# Patient Record
Sex: Female | Born: 1985
Health system: Southern US, Community
[De-identification: ages and names within clinical notes are randomized; demographics above are authoritative.]

## PROBLEM LIST (undated history)

## (undated) DIAGNOSIS — D573 Sickle-cell trait: Secondary | ICD-10-CM

## (undated) DIAGNOSIS — D759 Disease of blood and blood-forming organs, unspecified: Secondary | ICD-10-CM

## (undated) DIAGNOSIS — K760 Fatty (change of) liver, not elsewhere classified: Secondary | ICD-10-CM

## (undated) DIAGNOSIS — Z789 Other specified health status: Secondary | ICD-10-CM

## (undated) DIAGNOSIS — R7989 Other specified abnormal findings of blood chemistry: Secondary | ICD-10-CM

## (undated) DIAGNOSIS — D649 Anemia, unspecified: Secondary | ICD-10-CM

## (undated) DIAGNOSIS — T8859XA Other complications of anesthesia, initial encounter: Secondary | ICD-10-CM

## (undated) DIAGNOSIS — F1910 Other psychoactive substance abuse, uncomplicated: Secondary | ICD-10-CM

## (undated) HISTORY — PX: NO PAST SURGERIES: SHX2092

## (undated) HISTORY — DX: Anemia, unspecified: D64.9

---

## 2010-02-07 ENCOUNTER — Ambulatory Visit (HOSPITAL_COMMUNITY): Admission: RE | Admit: 2010-02-07 | Discharge: 2010-02-07 | Payer: Self-pay | Admitting: Family Medicine

## 2010-05-04 ENCOUNTER — Encounter: Payer: Self-pay | Admitting: Obstetrics and Gynecology

## 2010-05-04 ENCOUNTER — Inpatient Hospital Stay (HOSPITAL_COMMUNITY): Admission: AD | Admit: 2010-05-04 | Discharge: 2010-05-06 | Payer: Self-pay | Admitting: Obstetrics and Gynecology

## 2010-05-04 ENCOUNTER — Ambulatory Visit: Payer: Self-pay | Admitting: Obstetrics & Gynecology

## 2010-12-21 LAB — CBC
HCT: 31.5 % — ABNORMAL LOW (ref 36.0–46.0)
HCT: 40.6 % (ref 36.0–46.0)
Hemoglobin: 10.9 g/dL — ABNORMAL LOW (ref 12.0–15.0)
MCH: 34.4 pg — ABNORMAL HIGH (ref 26.0–34.0)
MCHC: 34.2 g/dL (ref 30.0–36.0)
MCV: 98.5 fL (ref 78.0–100.0)
MCV: 99.1 fL (ref 78.0–100.0)
RBC: 3.18 MIL/uL — ABNORMAL LOW (ref 3.87–5.11)
RBC: 4.12 MIL/uL (ref 3.87–5.11)
WBC: 7.4 10*3/uL (ref 4.0–10.5)

## 2010-12-21 LAB — ABO/RH: ABO/RH(D): O POS

## 2010-12-21 LAB — TYPE AND SCREEN: ABO/RH(D): O POS

## 2012-10-06 NOTE — L&D Delivery Note (Signed)
Delivery Note  SVD viable female Apgars 9,9 over intact perineum.  Placenta delivered spontaneously intact with 3VC. good support and hemostasis noted and R/V exam confirms.  PH art was not done  Mother and baby were doing well.  EBL 200cc  [name], MD

## 2013-01-25 LAB — OB RESULTS CONSOLE GC/CHLAMYDIA: Chlamydia: NEGATIVE

## 2013-01-25 LAB — OB RESULTS CONSOLE HIV ANTIBODY (ROUTINE TESTING): HIV: NONREACTIVE

## 2013-01-25 LAB — OB RESULTS CONSOLE ANTIBODY SCREEN: Antibody Screen: NEGATIVE

## 2013-01-25 LAB — OB RESULTS CONSOLE RUBELLA ANTIBODY, IGM: Rubella: IMMUNE

## 2013-01-25 LAB — OB RESULTS CONSOLE HEPATITIS B SURFACE ANTIGEN: Hepatitis B Surface Ag: NEGATIVE

## 2013-01-25 LAB — OB RESULTS CONSOLE RPR: RPR: NONREACTIVE

## 2013-06-29 LAB — OB RESULTS CONSOLE GBS: GBS: NEGATIVE

## 2013-07-21 ENCOUNTER — Inpatient Hospital Stay (HOSPITAL_COMMUNITY)
Admission: AD | Admit: 2013-07-21 | Discharge: 2013-07-21 | Disposition: A | Discharge status: Home / Self Care | Payer: Medicaid Other | Source: Ambulatory Visit | Attending: Obstetrics and Gynecology | Admitting: Obstetrics and Gynecology

## 2013-07-21 ENCOUNTER — Encounter (HOSPITAL_COMMUNITY): Payer: Self-pay | Admitting: *Deleted

## 2013-07-21 DIAGNOSIS — O479 False labor, unspecified: Secondary | ICD-10-CM | POA: Insufficient documentation

## 2013-07-21 HISTORY — DX: Other specified health status: Z78.9

## 2013-07-21 HISTORY — DX: Sickle-cell trait: D57.3

## 2013-07-21 NOTE — MAU Note (Signed)
Pt G3 P2 at 39.1wks with contractions every 8-36min.  No problems with pregnancy.

## 2013-07-22 ENCOUNTER — Inpatient Hospital Stay (HOSPITAL_COMMUNITY)
Admission: AD | Admit: 2013-07-22 | Discharge: 2013-07-22 | Disposition: A | Discharge status: Home / Self Care | Payer: BC Managed Care – PPO | Source: Ambulatory Visit | Attending: Obstetrics and Gynecology | Admitting: Obstetrics and Gynecology

## 2013-07-22 ENCOUNTER — Inpatient Hospital Stay (HOSPITAL_COMMUNITY)
Admission: AD | Admit: 2013-07-22 | Discharge: 2013-07-25 | DRG: 767 | Disposition: A | Discharge status: Home / Self Care | Payer: Medicaid Other | Source: Ambulatory Visit | Attending: Obstetrics and Gynecology | Admitting: Obstetrics and Gynecology

## 2013-07-22 DIAGNOSIS — Z302 Encounter for sterilization: Secondary | ICD-10-CM

## 2013-07-22 DIAGNOSIS — O479 False labor, unspecified: Secondary | ICD-10-CM | POA: Insufficient documentation

## 2013-07-22 MED ORDER — ZOLPIDEM TARTRATE 5 MG PO TABS
5.0000 mg | ORAL_TABLET | Freq: Once | ORAL | Status: AC
  Administered 2013-07-22: 5 mg via ORAL
  Filled 2013-07-22: qty 1

## 2013-07-22 MED ORDER — BUTORPHANOL TARTRATE 1 MG/ML IJ SOLN
2.0000 mg | Freq: Once | INTRAMUSCULAR | Status: AC
  Administered 2013-07-23: 2 mg via INTRAMUSCULAR
  Filled 2013-07-22: qty 2

## 2013-07-22 MED ORDER — PROMETHAZINE HCL 25 MG/ML IJ SOLN
12.5000 mg | Freq: Once | INTRAMUSCULAR | Status: AC
  Administered 2013-07-23: 12.5 mg via INTRAMUSCULAR
  Filled 2013-07-22: qty 1

## 2013-07-22 NOTE — Progress Notes (Signed)
Dr Rana Snare notified of patient request for pain medications. Order received for 2mg  stadol im and phenergan 12.5mg  im.

## 2013-07-22 NOTE — MAU Note (Signed)
Patient states she is having irregular ( ) contractions with bloody show. Denies leaking. Reports fetal movement but not as much as usual.

## 2013-07-22 NOTE — Progress Notes (Signed)
Dr Rana Snare notified that patient returns for labor eval after discharge about 1830pm tonight. He is aware of tracing, ctx pattern, sve result. Order to recheck cervix in an hour or two.

## 2013-07-23 ENCOUNTER — Inpatient Hospital Stay (HOSPITAL_COMMUNITY): Payer: Medicaid Other | Admitting: Anesthesiology

## 2013-07-23 ENCOUNTER — Encounter (HOSPITAL_COMMUNITY): Payer: Self-pay | Admitting: Anesthesiology

## 2013-07-23 ENCOUNTER — Encounter (HOSPITAL_COMMUNITY): Payer: Medicaid Other | Admitting: Anesthesiology

## 2013-07-23 DIAGNOSIS — O479 False labor, unspecified: Secondary | ICD-10-CM | POA: Diagnosis present

## 2013-07-23 LAB — CBC
MCH: 30.9 pg (ref 26.0–34.0)
MCHC: 35.5 g/dL (ref 30.0–36.0)
MCV: 87 fL (ref 78.0–100.0)
Platelets: 125 10*3/uL — ABNORMAL LOW (ref 150–400)
RDW: 13.2 % (ref 11.5–15.5)

## 2013-07-23 LAB — TYPE AND SCREEN: Antibody Screen: NEGATIVE

## 2013-07-23 MED ORDER — SENNOSIDES-DOCUSATE SODIUM 8.6-50 MG PO TABS
2.0000 | ORAL_TABLET | ORAL | Status: DC
  Administered 2013-07-23 – 2013-07-24 (×2): 2 via ORAL
  Filled 2013-07-23: qty 2
  Filled 2013-07-23: qty 1
  Filled 2013-07-23: qty 2

## 2013-07-23 MED ORDER — TETANUS-DIPHTH-ACELL PERTUSSIS 5-2.5-18.5 LF-MCG/0.5 IM SUSP: 0.5000 mL | Freq: Once | INTRAMUSCULAR | Status: DC

## 2013-07-23 MED ORDER — FENTANYL 2.5 MCG/ML BUPIVACAINE 1/10 % EPIDURAL INFUSION (WH - ANES)
INTRAMUSCULAR | Status: DC | PRN
  Administered 2013-07-23: 14 mL/h via EPIDURAL

## 2013-07-23 MED ORDER — LIDOCAINE HCL (PF) 1 % IJ SOLN
INTRAMUSCULAR | Status: DC | PRN
  Administered 2013-07-23 (×2): 4 mL

## 2013-07-23 MED ORDER — LACTATED RINGERS IV SOLN
INTRAVENOUS | Status: DC
  Administered 2013-07-23 (×2): via INTRAVENOUS

## 2013-07-23 MED ORDER — PHENYLEPHRINE 40 MCG/ML (10ML) SYRINGE FOR IV PUSH (FOR BLOOD PRESSURE SUPPORT)
80.0000 ug | PREFILLED_SYRINGE | INTRAVENOUS | Status: DC | PRN
  Filled 2013-07-23: qty 2
  Filled 2013-07-23: qty 5

## 2013-07-23 MED ORDER — FLEET ENEMA 7-19 GM/118ML RE ENEM: 1.0000 | ENEMA | RECTAL | Status: DC | PRN

## 2013-07-23 MED ORDER — FENTANYL 2.5 MCG/ML BUPIVACAINE 1/10 % EPIDURAL INFUSION (WH - ANES)
14.0000 mL/h | INTRAMUSCULAR | Status: DC | PRN
  Filled 2013-07-23: qty 125

## 2013-07-23 MED ORDER — DIPHENHYDRAMINE HCL 50 MG/ML IJ SOLN: 12.5000 mg | INTRAMUSCULAR | Status: DC | PRN

## 2013-07-23 MED ORDER — PHENYLEPHRINE 40 MCG/ML (10ML) SYRINGE FOR IV PUSH (FOR BLOOD PRESSURE SUPPORT)
80.0000 ug | PREFILLED_SYRINGE | INTRAVENOUS | Status: DC | PRN
  Filled 2013-07-23: qty 2

## 2013-07-23 MED ORDER — CITRIC ACID-SODIUM CITRATE 334-500 MG/5ML PO SOLN: 30.0000 mL | ORAL | Status: DC | PRN

## 2013-07-23 MED ORDER — LACTATED RINGERS IV SOLN: 500.0000 mL | INTRAVENOUS | Status: DC | PRN

## 2013-07-23 MED ORDER — OXYTOCIN 40 UNITS IN LACTATED RINGERS INFUSION - SIMPLE MED
62.5000 mL/h | INTRAVENOUS | Status: DC
  Filled 2013-07-23 (×2): qty 1000

## 2013-07-23 MED ORDER — ZOLPIDEM TARTRATE 5 MG PO TABS: 5.0000 mg | ORAL_TABLET | Freq: Every evening | ORAL | Status: DC | PRN

## 2013-07-23 MED ORDER — ONDANSETRON HCL 4 MG PO TABS: 4.0000 mg | ORAL_TABLET | ORAL | Status: DC | PRN

## 2013-07-23 MED ORDER — LACTATED RINGERS IV SOLN
500.0000 mL | Freq: Once | INTRAVENOUS | Status: AC
  Administered 2013-07-23: 500 mL via INTRAVENOUS

## 2013-07-23 MED ORDER — LANOLIN HYDROUS EX OINT: TOPICAL_OINTMENT | CUTANEOUS | Status: DC | PRN

## 2013-07-23 MED ORDER — BENZOCAINE-MENTHOL 20-0.5 % EX AERO
1.0000 "application " | INHALATION_SPRAY | CUTANEOUS | Status: DC | PRN
  Administered 2013-07-23: 1 via TOPICAL
  Filled 2013-07-23: qty 56

## 2013-07-23 MED ORDER — OXYTOCIN BOLUS FROM INFUSION
500.0000 mL | INTRAVENOUS | Status: DC
  Administered 2013-07-23: 500 mL via INTRAVENOUS

## 2013-07-23 MED ORDER — EPHEDRINE 5 MG/ML INJ
10.0000 mg | INTRAVENOUS | Status: DC | PRN
  Filled 2013-07-23: qty 2
  Filled 2013-07-23: qty 4

## 2013-07-23 MED ORDER — EPHEDRINE 5 MG/ML INJ
10.0000 mg | INTRAVENOUS | Status: DC | PRN
  Filled 2013-07-23: qty 2

## 2013-07-23 MED ORDER — ONDANSETRON HCL 4 MG/2ML IJ SOLN: 4.0000 mg | INTRAMUSCULAR | Status: DC | PRN

## 2013-07-23 MED ORDER — PRENATAL MULTIVITAMIN CH
1.0000 | ORAL_TABLET | Freq: Every day | ORAL | Status: DC
  Administered 2013-07-24 – 2013-07-25 (×2): 1 via ORAL
  Filled 2013-07-23 (×2): qty 1

## 2013-07-23 MED ORDER — SIMETHICONE 80 MG PO CHEW: 80.0000 mg | CHEWABLE_TABLET | ORAL | Status: DC | PRN

## 2013-07-23 MED ORDER — MEASLES, MUMPS & RUBELLA VAC ~~LOC~~ INJ
0.5000 mL | INJECTION | Freq: Once | SUBCUTANEOUS | Status: DC
  Filled 2013-07-23: qty 0.5

## 2013-07-23 MED ORDER — MEDROXYPROGESTERONE ACETATE 150 MG/ML IM SUSP: 150.0000 mg | INTRAMUSCULAR | Status: DC | PRN

## 2013-07-23 MED ORDER — IBUPROFEN 600 MG PO TABS: 600.0000 mg | ORAL_TABLET | Freq: Four times a day (QID) | ORAL | Status: DC | PRN

## 2013-07-23 MED ORDER — IBUPROFEN 600 MG PO TABS
600.0000 mg | ORAL_TABLET | Freq: Four times a day (QID) | ORAL | Status: DC
  Administered 2013-07-23 – 2013-07-25 (×8): 600 mg via ORAL
  Filled 2013-07-23 (×8): qty 1

## 2013-07-23 MED ORDER — ONDANSETRON HCL 4 MG/2ML IJ SOLN: 4.0000 mg | Freq: Four times a day (QID) | INTRAMUSCULAR | Status: DC | PRN

## 2013-07-23 MED ORDER — WITCH HAZEL-GLYCERIN EX PADS: 1.0000 "application " | MEDICATED_PAD | CUTANEOUS | Status: DC | PRN

## 2013-07-23 MED ORDER — OXYCODONE-ACETAMINOPHEN 5-325 MG PO TABS
1.0000 | ORAL_TABLET | ORAL | Status: DC | PRN
Start: 2013-07-23 — End: 2013-07-23

## 2013-07-23 MED ORDER — DIPHENHYDRAMINE HCL 25 MG PO CAPS: 25.0000 mg | ORAL_CAPSULE | Freq: Four times a day (QID) | ORAL | Status: DC | PRN

## 2013-07-23 MED ORDER — OXYCODONE-ACETAMINOPHEN 5-325 MG PO TABS
1.0000 | ORAL_TABLET | ORAL | Status: DC | PRN
  Administered 2013-07-23: 1 via ORAL
  Administered 2013-07-23 – 2013-07-24 (×5): 2 via ORAL
  Administered 2013-07-25: 1 via ORAL
  Administered 2013-07-25 (×3): 2 via ORAL
  Administered 2013-07-25: 1 via ORAL
  Filled 2013-07-23 (×4): qty 2
  Filled 2013-07-23: qty 1
  Filled 2013-07-23 (×3): qty 2
  Filled 2013-07-23 (×3): qty 1
  Filled 2013-07-23: qty 2

## 2013-07-23 MED ORDER — ACETAMINOPHEN 325 MG PO TABS: 650.0000 mg | ORAL_TABLET | ORAL | Status: DC | PRN

## 2013-07-23 MED ORDER — LIDOCAINE HCL (PF) 1 % IJ SOLN
30.0000 mL | INTRAMUSCULAR | Status: DC | PRN
  Filled 2013-07-23 (×3): qty 30

## 2013-07-23 MED ORDER — DIBUCAINE 1 % RE OINT: 1.0000 "application " | TOPICAL_OINTMENT | RECTAL | Status: DC | PRN

## 2013-07-23 NOTE — H&P (Signed)
Amanda Jarvis is a 27 y.o. female presenting for labor.  Seen several times in last 2 days in triage.  Now presents with SROM clear fluid at 330 and Cx change to 6 cm. . History OB History   Grav Para Term Preterm Abortions TAB SAB Ect Mult Living   3 2 2       2      Past Medical History  Diagnosis Date  . Sickle cell trait   . Medical history non-contributory    Past Surgical History  Procedure Laterality Date  . No past surgeries     Family History: family history is not on file. Social History:  reports that she has never smoked. She does not have any smokeless tobacco history on file. She reports that she does not drink alcohol or use illicit drugs.   Prenatal Transfer Tool  Maternal Diabetes: No Genetic Screening: Normal Father Sickle screen negative .  Mother Hg AS Maternal Ultrasounds/Referrals: Normal Fetal Ultrasounds or other Referrals:  None Maternal Substance Abuse:  No Significant Maternal Medications:  None Significant Maternal Lab Results:  None Other Comments:  None  ROS  Dilation: 10 Effacement (%): 100 Station: +3 Exam by:: S Grindstaff RN Blood pressure 111/66, pulse 122, temperature 98.2 F (36.8 C), temperature source Oral, resp. rate 16, height 5\' 4"  (1.626 m), weight 64.411 kg (142 lb), SpO2 99.00%. Exam Physical Exam  Prenatal labs: ABO, Rh: --/--/O POS (10/18 0310) Antibody: NEG (10/18 0310) Rubella: Immune (04/22 0000) RPR: Nonreactive (04/22 0000)  HBsAg: Negative (04/22 0000)  HIV: Non-reactive (04/22 0000)  GBS: Negative (09/24 0000)   Assessment/Plan: IUP at term in active labor with SROM Anticipate SVD Pt had previously discussed BTL.  I discussed the pros/Cons/ Risks/Benefits She now is unsure and may elect for IUD   Kalid Ghan C 07/23/2013, 8:42 AM

## 2013-07-23 NOTE — Anesthesia Procedure Notes (Signed)
Epidural Patient location during procedure: OB Start time: 07/23/2013 3:55 AM  Staffing Anesthesiologist: Lynore Coscia A. Performed by: anesthesiologist   Preanesthetic Checklist Completed: patient identified, site marked, surgical consent, pre-op evaluation, timeout performed, IV checked, risks and benefits discussed and monitors and equipment checked  Epidural Patient position: sitting Prep: site prepped and draped and DuraPrep Patient monitoring: continuous pulse ox and blood pressure Approach: midline Injection technique: LOR air  Needle:  Needle type: Tuohy  Needle gauge: 17 G Needle length: 9 cm and 9 Needle insertion depth: 5 cm cm Catheter type: closed end flexible Catheter size: 19 Gauge Catheter at skin depth: 10 cm Test dose: negative  Assessment Events: blood not aspirated, injection not painful, no injection resistance, negative IV test and no paresthesia  Additional Notes Patient identified. Risks and benefits discussed including failed block, incomplete  Pain control, post dural puncture headache, nerve damage, paralysis, blood pressure Changes, nausea, vomiting, reactions to medications-both toxic and allergic and post Partum back pain. All questions were answered. Patient expressed understanding and wished to proceed. Sterile technique was used throughout procedure. Epidural site was Dressed with sterile barrier dressing. No paresthesias, signs of intravascular injection Or signs of intrathecal spread were encountered.  Patient was more comfortable after the epidural was dosed. Please see RN's note for documentation of vital signs and FHR which are stable. Reason for block:procedure for pain

## 2013-07-23 NOTE — Anesthesia Preprocedure Evaluation (Signed)
Anesthesia Evaluation  Patient identified by MRN, date of birth, ID band Patient awake    Reviewed: Allergy & Precautions, H&P , Patient's Chart, lab work & pertinent test results  Airway Mallampati: III TM Distance: >3 FB Neck ROM: Full    Dental no notable dental hx. (+) Teeth Intact   Pulmonary neg pulmonary ROS,  breath sounds clear to auscultation  Pulmonary exam normal       Cardiovascular negative cardio ROS  Rhythm:Regular Rate:Normal     Neuro/Psych negative neurological ROS  negative psych ROS   GI/Hepatic negative GI ROS, Neg liver ROS,   Endo/Other  negative endocrine ROS  Renal/GU negative Renal ROS  negative genitourinary   Musculoskeletal negative musculoskeletal ROS (+)   Abdominal   Peds  Hematology  (+) Blood dyscrasia, Sickle cell trait , Gestational Thrombocytopenia   Anesthesia Other Findings   Reproductive/Obstetrics (+) Pregnancy                           Anesthesia Physical Anesthesia Plan  ASA: II  Anesthesia Plan: Epidural   Post-op Pain Management:    Induction:   Airway Management Planned: Natural Airway  Additional Equipment:   Intra-op Plan:   Post-operative Plan:   Informed Consent: I have reviewed the patients History and Physical, chart, labs and discussed the procedure including the risks, benefits and alternatives for the proposed anesthesia with the patient or authorized representative who has indicated his/her understanding and acceptance.   Dental advisory given  Plan Discussed with: Anesthesiologist  Anesthesia Plan Comments:         Anesthesia Quick Evaluation

## 2013-07-23 NOTE — Progress Notes (Signed)
Dr Rana Snare notified of patient. Leaking rupture of membranes and 6.5cm/90%/-1 per v. Smith cnm. Orders received for admit

## 2013-07-24 ENCOUNTER — Inpatient Hospital Stay (HOSPITAL_COMMUNITY): Payer: Medicaid Other

## 2013-07-24 ENCOUNTER — Encounter (HOSPITAL_COMMUNITY): Payer: Medicaid Other

## 2013-07-24 ENCOUNTER — Encounter (HOSPITAL_COMMUNITY)
Admission: AD | Disposition: A | Discharge status: Home / Self Care | Payer: Self-pay | Source: Ambulatory Visit | Attending: Obstetrics and Gynecology

## 2013-07-24 HISTORY — PX: TUBAL LIGATION: SHX77

## 2013-07-24 LAB — CBC
HCT: 30.2 % — ABNORMAL LOW (ref 36.0–46.0)
MCHC: 35.4 g/dL (ref 30.0–36.0)
MCV: 87.5 fL (ref 78.0–100.0)
Platelets: 123 10*3/uL — ABNORMAL LOW (ref 150–400)
RDW: 13.5 % (ref 11.5–15.5)
WBC: 10.3 10*3/uL (ref 4.0–10.5)

## 2013-07-24 LAB — SURGICAL PCR SCREEN: Staphylococcus aureus: NEGATIVE

## 2013-07-24 SURGERY — LIGATION, FALLOPIAN TUBE, POSTPARTUM
Anesthesia: Epidural | Site: Abdomen | Laterality: Bilateral

## 2013-07-24 MED ORDER — KETOROLAC TROMETHAMINE 30 MG/ML IJ SOLN: 15.0000 mg | Freq: Once | INTRAMUSCULAR | Status: AC | PRN

## 2013-07-24 MED ORDER — PROMETHAZINE HCL 25 MG/ML IJ SOLN: 6.2500 mg | INTRAMUSCULAR | Status: DC | PRN

## 2013-07-24 MED ORDER — FENTANYL CITRATE 0.05 MG/ML IJ SOLN
INTRAMUSCULAR | Status: AC
  Filled 2013-07-24: qty 5

## 2013-07-24 MED ORDER — 0.9 % SODIUM CHLORIDE (POUR BTL) OPTIME
TOPICAL | Status: DC | PRN
  Administered 2013-07-24: 1000 mL

## 2013-07-24 MED ORDER — LIDOCAINE-EPINEPHRINE (PF) 2 %-1:200000 IJ SOLN
INTRAMUSCULAR | Status: AC
  Filled 2013-07-24: qty 20

## 2013-07-24 MED ORDER — LACTATED RINGERS IV SOLN
INTRAVENOUS | Status: DC
  Administered 2013-07-24: 10:00:00 via INTRAVENOUS

## 2013-07-24 MED ORDER — LIDOCAINE-EPINEPHRINE (PF) 2 %-1:200000 IJ SOLN
INTRAMUSCULAR | Status: DC | PRN
  Administered 2013-07-24: 5 mL
  Administered 2013-07-24: 5 mL via INTRADERMAL
  Administered 2013-07-24 (×3): 5 mL

## 2013-07-24 MED ORDER — LIDOCAINE-EPINEPHRINE (PF) 2 %-1:200000 IJ SOLN: INTRAMUSCULAR | Status: DC | PRN

## 2013-07-24 MED ORDER — SODIUM BICARBONATE 8.4 % IV SOLN
INTRAVENOUS | Status: AC
  Filled 2013-07-24: qty 50

## 2013-07-24 MED ORDER — ONDANSETRON HCL 4 MG/2ML IJ SOLN
INTRAMUSCULAR | Status: AC
  Filled 2013-07-24: qty 2

## 2013-07-24 MED ORDER — MIDAZOLAM HCL 2 MG/2ML IJ SOLN
INTRAMUSCULAR | Status: DC | PRN
  Administered 2013-07-24: 1 mg via INTRAVENOUS

## 2013-07-24 MED ORDER — BUPIVACAINE HCL (PF) 0.25 % IJ SOLN
INTRAMUSCULAR | Status: AC
  Filled 2013-07-24: qty 30

## 2013-07-24 MED ORDER — FENTANYL CITRATE 0.05 MG/ML IJ SOLN
INTRAMUSCULAR | Status: DC | PRN
  Administered 2013-07-24: 50 ug via INTRAVENOUS
  Administered 2013-07-24: 100 ug via INTRAVENOUS
  Administered 2013-07-24: 50 ug via INTRAVENOUS

## 2013-07-24 MED ORDER — KETOROLAC TROMETHAMINE 30 MG/ML IJ SOLN
INTRAMUSCULAR | Status: DC | PRN
  Administered 2013-07-24: 30 mg via INTRAVENOUS

## 2013-07-24 MED ORDER — FENTANYL CITRATE 0.05 MG/ML IJ SOLN
INTRAMUSCULAR | Status: AC
  Filled 2013-07-24: qty 2

## 2013-07-24 MED ORDER — METOCLOPRAMIDE HCL 10 MG PO TABS
10.0000 mg | ORAL_TABLET | Freq: Once | ORAL | Status: AC
  Administered 2013-07-24: 10 mg via ORAL
  Filled 2013-07-24: qty 1

## 2013-07-24 MED ORDER — LACTATED RINGERS IV SOLN
INTRAVENOUS | Status: DC | PRN
  Administered 2013-07-24 (×2): via INTRAVENOUS

## 2013-07-24 MED ORDER — FAMOTIDINE 20 MG PO TABS
40.0000 mg | ORAL_TABLET | Freq: Once | ORAL | Status: AC
  Administered 2013-07-24: 40 mg via ORAL
  Filled 2013-07-24: qty 2

## 2013-07-24 MED ORDER — MEPERIDINE HCL 25 MG/ML IJ SOLN: 6.2500 mg | INTRAMUSCULAR | Status: DC | PRN

## 2013-07-24 MED ORDER — ONDANSETRON HCL 4 MG/2ML IJ SOLN
INTRAMUSCULAR | Status: DC | PRN
  Administered 2013-07-24: 4 mg via INTRAVENOUS

## 2013-07-24 MED ORDER — MIDAZOLAM HCL 2 MG/2ML IJ SOLN
INTRAMUSCULAR | Status: AC
  Filled 2013-07-24: qty 2

## 2013-07-24 MED ORDER — FENTANYL CITRATE 0.05 MG/ML IJ SOLN: 25.0000 ug | INTRAMUSCULAR | Status: DC | PRN

## 2013-07-24 MED ORDER — KETOROLAC TROMETHAMINE 30 MG/ML IJ SOLN
INTRAMUSCULAR | Status: AC
  Filled 2013-07-24: qty 1

## 2013-07-24 MED ORDER — BUPIVACAINE HCL (PF) 0.25 % IJ SOLN
INTRAMUSCULAR | Status: DC | PRN
  Administered 2013-07-24: 30 mL

## 2013-07-24 MED ORDER — MIDAZOLAM HCL 2 MG/2ML IJ SOLN: 0.5000 mg | Freq: Once | INTRAMUSCULAR | Status: AC | PRN

## 2013-07-24 SURGICAL SUPPLY — 18 items
CLIP FILSHIE TUBAL LIGA STRL (Clip) ×2 IMPLANT
CLOTH BEACON ORANGE TIMEOUT ST (SAFETY) ×2 IMPLANT
CONTAINER PREFILL 10% NBF 15ML (MISCELLANEOUS) ×4 IMPLANT
DRSG COVADERM PLUS 2X2 (GAUZE/BANDAGES/DRESSINGS) ×2 IMPLANT
GLOVE BIO SURGEON STRL SZ8 (GLOVE) ×2 IMPLANT
GLOVE SURG ORTHO 8.0 STRL STRW (GLOVE) ×6 IMPLANT
GOWN PREVENTION PLUS LG XLONG (DISPOSABLE) ×4 IMPLANT
NEEDLE HYPO 25X1 1.5 SAFETY (NEEDLE) ×2 IMPLANT
NS IRRIG 1000ML POUR BTL (IV SOLUTION) ×2 IMPLANT
PACK ABDOMINAL MINOR (CUSTOM PROCEDURE TRAY) ×2 IMPLANT
SPONGE LAP 4X18 X RAY DECT (DISPOSABLE) IMPLANT
SUT VIC AB 0 CT1 27 (SUTURE) ×1
SUT VIC AB 0 CT1 27XBRD ANBCTR (SUTURE) ×1 IMPLANT
SUT VICRYL RAPIDE 3 0 (SUTURE) ×2 IMPLANT
SYR CONTROL 10ML LL (SYRINGE) ×2 IMPLANT
TOWEL OR 17X24 6PK STRL BLUE (TOWEL DISPOSABLE) ×4 IMPLANT
TRAY FOLEY CATH 14FR (SET/KITS/TRAYS/PACK) ×2 IMPLANT
WATER STERILE IRR 1000ML POUR (IV SOLUTION) ×2 IMPLANT

## 2013-07-24 NOTE — Anesthesia Preprocedure Evaluation (Signed)
Anesthesia Evaluation  Patient identified by MRN, date of birth, ID band Patient awake    Reviewed: Allergy & Precautions, H&P , Patient's Chart, lab work & pertinent test results  Airway Mallampati: III TM Distance: >3 FB Neck ROM: Full    Dental no notable dental hx. (+) Teeth Intact   Pulmonary neg pulmonary ROS,  breath sounds clear to auscultation  Pulmonary exam normal       Cardiovascular negative cardio ROS  Rhythm:Regular Rate:Normal     Neuro/Psych negative neurological ROS  negative psych ROS   GI/Hepatic negative GI ROS, Neg liver ROS,   Endo/Other  negative endocrine ROS  Renal/GU negative Renal ROS  negative genitourinary   Musculoskeletal negative musculoskeletal ROS (+)   Abdominal   Peds  Hematology  (+) Blood dyscrasia, Sickle cell trait , Gestational Thrombocytopenia   Anesthesia Other Findings   Reproductive/Obstetrics                           Anesthesia Physical  Anesthesia Plan  ASA: II  Anesthesia Plan: Epidural   Post-op Pain Management:    Induction:   Airway Management Planned: Natural Airway  Additional Equipment:   Intra-op Plan:   Post-operative Plan:   Informed Consent: I have reviewed the patients History and Physical, chart, labs and discussed the procedure including the risks, benefits and alternatives for the proposed anesthesia with the patient or authorized representative who has indicated his/her understanding and acceptance.   Dental advisory given  Plan Discussed with: Anesthesiologist  Anesthesia Plan Comments:         Anesthesia Quick Evaluation

## 2013-07-24 NOTE — Anesthesia Postprocedure Evaluation (Signed)
Anesthesia Post Note  Patient: Amanda Jarvis  Procedure(s) Performed: * No procedures listed *  Anesthesia type: Epidural  Patient location: Mother/Baby  Post pain: Pain level controlled  Post assessment: Post-op Vital signs reviewed  Last Vitals:  Filed Vitals:   07/24/13 0527  BP: 97/52  Pulse: 61  Temp: 36.5 C  Resp: 19    Post vital signs: Reviewed  Level of consciousness:alert  Complications: No apparent anesthesia complications

## 2013-07-24 NOTE — Transfer of Care (Signed)
Immediate Anesthesia Transfer of Care Note  Patient: Amanda Jarvis  Procedure(s) Performed: Procedure(s): POST PARTUM TUBAL LIGATION (Bilateral)  Patient Location: PACU  Anesthesia Type:Epidural  Level of Consciousness: awake, alert  and oriented  Airway & Oxygen Therapy: Patient Spontanous Breathing  Post-op Assessment: Report given to PACU RN and Post -op Vital signs reviewed and stable  Post vital signs: stable  Complications: No apparent anesthesia complications

## 2013-07-24 NOTE — Anesthesia Postprocedure Evaluation (Signed)
  Anesthesia Post-op Note  Patient: Amanda Jarvis  Procedure(s) Performed: Procedure(s): POST PARTUM TUBAL LIGATION (Bilateral)  Patient Location: Mother/Baby  Anesthesia Type:Epidural  Level of Consciousness: awake  Airway and Oxygen Therapy: Patient Spontanous Breathing  Post-op Pain: mild  Post-op Assessment: Patient's Cardiovascular Status Stable and Respiratory Function Stable  Post-op Vital Signs: stable  Complications: No apparent anesthesia complications

## 2013-07-24 NOTE — Op Note (Signed)
Pre op:  Multiparity and desires sterility Post op:  Same Procedure:  Postpartum Bilateral Tubal ligation by Filshie Clip Surgeon:  Dorinne Graeff Anesthesia:  Epidural Procedure:  After informed consent and sterile prep and drape, time out was carried out.  A 2 cm infraumbilical skin incision was made and taken down sharply to the fascia which was incised with mayo scissors and the peritoneum identified and opened sharply.  Army/[employer] retractors placed.  The fallopian tubes were identified by their fimbriated ends and filshie clips were placed across the tubes at the midportion of each tube.  Good placement was noted bilaterally. The fascia was then closed with 0 vicryl.  The skin closed with 3-0 vicryl rapide in a subcuticular stitch with good approximation noted.  The incision was then infiltrated with .25% marcaine, 10cc.  The patient was stable on transfer to the recovery room.  EBL Minimal.  Sponge and instrument counts were normal.  No complications.  

## 2013-07-24 NOTE — Anesthesia Postprocedure Evaluation (Signed)
  Anesthesia Post Note  Patient: Amanda Jarvis  Procedure(s) Performed: Procedure(s) (LRB): POST PARTUM TUBAL LIGATION (Bilateral)  Anesthesia type: Epidural  Patient location: PACU  Post pain: Pain level controlled  Post assessment: Post-op Vital signs reviewed  Last Vitals:  Filed Vitals:   07/24/13 1330  BP: 119/62  Pulse: 94  Temp:   Resp: 30    Post vital signs: Reviewed  Level of consciousness: awake  Complications: No apparent anesthesia complications

## 2013-07-24 NOTE — Progress Notes (Signed)
Post Partum Day 1 Subjective: no complaints, voiding and + flatus  Objective: Blood pressure 97/52, pulse 61, temperature 97.7 F (36.5 C), temperature source Oral, resp. rate 19, height 5\' 4"  (1.626 m), weight 64.411 kg (142 lb), SpO2 99.00%, unknown if currently breastfeeding.  Physical Exam:  General: alert, cooperative, appears stated age and no distress Lochia: appropriate Uterine Fundus: firm Incision: healing well DVT Evaluation: No evidence of DVT seen on physical exam.   Recent Labs  07/23/13 0310 07/24/13 0530  HGB 12.6 10.7*  HCT 35.5* 30.2*    Assessment/Plan: Plan for discharge tomorrow Desires PPTL today.  R&B discussed and informed consent obtained   LOS: 2 days   Jessejames Steelman C 07/24/2013, 9:42 AM

## 2013-07-25 ENCOUNTER — Encounter (HOSPITAL_COMMUNITY): Payer: Self-pay | Admitting: Obstetrics and Gynecology

## 2013-07-25 MED ORDER — OXYCODONE-ACETAMINOPHEN 5-325 MG PO TABS: 1.0000 | ORAL_TABLET | ORAL | Status: DC | PRN

## 2013-07-25 MED ORDER — IBUPROFEN 600 MG PO TABS: 600.0000 mg | ORAL_TABLET | Freq: Four times a day (QID) | ORAL | Status: DC

## 2013-07-25 NOTE — Discharge Summary (Signed)
Obstetric Discharge Summary Reason for Admission: onset of labor and rupture of membranes Prenatal Procedures: ultrasound Intrapartum Procedures: spontaneous vaginal delivery Postpartum Procedures: P.P. tubal ligation Complications-Operative and Postpartum: none Hemoglobin  Date Value Range Status  07/24/2013 10.7* 12.0 - 15.0 Jarvis/dL Final     HCT  Date Value Range Status  07/24/2013 30.2* 36.0 - 46.0 % Final    Physical Exam:  General: alert and cooperative Lochia: appropriate Uterine Fundus: firm Incision: umbilical incision CDI, perineum intact DVT Evaluation: No evidence of DVT seen on physical exam. Negative Homan's sign. No cords or calf tenderness. No significant calf/ankle edema.  Discharge Diagnoses: Term Pregnancy-delivered  Discharge Information: Date: 07/25/2013 Activity: pelvic rest Diet: routine Medications: PNV, Ibuprofen and Percocet Condition: stable Instructions: refer to practice specific booklet Discharge to: home   Newborn Data: Live born female  Birth Weight: 8 lb 2.3 oz ([id number] Jarvis) APGAR: 9, 9  Home with mother.  Amanda Jarvis 07/25/2013, 8:06 AM

## 2013-07-25 NOTE — Progress Notes (Signed)
Ur chart review completed.  

## 2014-08-07 ENCOUNTER — Encounter (HOSPITAL_COMMUNITY): Payer: Self-pay | Admitting: Obstetrics and Gynecology

## 2017-03-20 ENCOUNTER — Encounter: Payer: Self-pay | Admitting: Physician Assistant

## 2017-03-20 ENCOUNTER — Ambulatory Visit (INDEPENDENT_AMBULATORY_CARE_PROVIDER_SITE_OTHER): Payer: BLUE CROSS/BLUE SHIELD | Admitting: Physician Assistant

## 2017-03-20 VITALS — BP 117/71 | HR 52 | Temp 97.8°F | Resp 16 | Ht 64.75 in | Wt 146.4 lb

## 2017-03-20 DIAGNOSIS — Z124 Encounter for screening for malignant neoplasm of cervix: Secondary | ICD-10-CM

## 2017-03-20 DIAGNOSIS — R300 Dysuria: Secondary | ICD-10-CM | POA: Diagnosis not present

## 2017-03-20 DIAGNOSIS — Z0000 Encounter for general adult medical examination without abnormal findings: Secondary | ICD-10-CM

## 2017-03-20 DIAGNOSIS — Z130 Encounter for screening for diseases of the blood and blood-forming organs and certain disorders involving the immune mechanism: Secondary | ICD-10-CM | POA: Diagnosis not present

## 2017-03-20 DIAGNOSIS — R21 Rash and other nonspecific skin eruption: Secondary | ICD-10-CM

## 2017-03-20 DIAGNOSIS — Z13228 Encounter for screening for other metabolic disorders: Secondary | ICD-10-CM

## 2017-03-20 DIAGNOSIS — Z1329 Encounter for screening for other suspected endocrine disorder: Secondary | ICD-10-CM

## 2017-03-20 LAB — POCT WET + KOH PREP
Trich by wet prep: ABSENT
Yeast by KOH: ABSENT
Yeast by wet prep: ABSENT

## 2017-03-20 LAB — POCT URINALYSIS DIP (MANUAL ENTRY)
BILIRUBIN UA: NEGATIVE
BILIRUBIN UA: NEGATIVE mg/dL
Glucose, UA: NEGATIVE mg/dL
NITRITE UA: NEGATIVE
Protein Ur, POC: NEGATIVE mg/dL
RBC UA: NEGATIVE
Spec Grav, UA: 1.02 (ref 1.010–1.025)
Urobilinogen, UA: 0.2 E.U./dL
pH, UA: 5.5 (ref 5.0–8.0)

## 2017-03-20 LAB — POC MICROSCOPIC URINALYSIS (UMFC): Mucus: ABSENT

## 2017-03-20 MED ORDER — NITROFURANTOIN MONOHYD MACRO 100 MG PO CAPS: 100.0000 mg | ORAL_CAPSULE | Freq: Two times a day (BID) | ORAL | 0 refills | Status: DC

## 2017-03-20 MED ORDER — METRONIDAZOLE 500 MG PO TABS: 500.0000 mg | ORAL_TABLET | Freq: Two times a day (BID) | ORAL | 0 refills | Status: AC

## 2017-03-20 NOTE — Progress Notes (Signed)
PRIMARY CARE AT [facility] [address] [place] [address] [phone]  Date:  03/20/2017   Name:  Maisha Bogen   DOB:  1986/07/19   MRN:  [id number]  PCP:  Joretta Bachelor, PA    History of Present Illness:  Anandi Abramo is a 31 y.o. female patient who presents to PCP with  Chief Complaint  Patient presents with  . Annual Exam    with pap     DIET: lots of carbs; lots of fruits.  Vegetables daily.  No protein restriction.  Soft drink once per week.  Up to 64 oz water per day.  1 cup of coffee per day   BM: NORMAL.  No blood or black stool.  No blood or black stool.  URINATION: Some discomfort in the last 3 days, but otherwise normal.  No hematuria, nausea, or frequency.  SLEEP: sleep is fine.  7 hours per night.    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  Exercising: none at this time.  She likes to swim, park and watch kids.   Sexually active.  Normal.  No heavy bleeding.  She does have heavy cramping.  Worsened after 3rd child.  13, 7, and 4.  All vaginal births.  No complications.  No abnormal pap smear.  No hx of std.    Back pain since she was young while climbing down tree at 17.  Found to have back curvature.  No lower extremity weakness, or paresthesia. Lesions on her chest that are present at upper chest.  They are asymptomatic.  Worsen with etOH that same day.    There are no active problems to display for this patient.   Past Medical History:  Diagnosis Date  . Anemia    with 1st pregnancy  . Medical history non-contributory   . Sickle cell trait [facility])     Past Surgical History:  Procedure Laterality Date  . NO PAST SURGERIES    . TUBAL LIGATION Bilateral 07/24/2013   Procedure: POST PARTUM TUBAL LIGATION;  Surgeon: Luz Lex, MD;  Location: [facility];  Service: Gynecology;  Laterality: Bilateral;    Social History  Substance Use Topics  . Smoking status: Never Smoker  . Smokeless tobacco: Never Used  .  Alcohol use 2.4 oz/week    4 Glasses of wine per week    Family History  Problem Relation Age of Onset  . Hyperlipidemia Maternal Grandmother   . Stroke Paternal Grandfather     No Known Allergies  Medication list has been reviewed and updated.  No current outpatient prescriptions on file prior to visit.   No current facility-administered medications on file prior to visit.     Review of Systems  Constitutional: Negative for chills and fever.  HENT: Negative for ear discharge, ear pain and sore throat.   Eyes: Negative for blurred vision and double vision.  Respiratory: Negative for cough, shortness of breath and wheezing.   Cardiovascular: Negative for chest pain, palpitations and leg swelling.  Gastrointestinal: Negative for diarrhea, nausea and vomiting.  Genitourinary: Positive for dysuria. Negative for frequency and hematuria.  Musculoskeletal: Positive for back pain.  Skin: Positive for rash. Negative for itching.  Neurological: Negative for dizziness and headaches.   ROS otherwise unremarkable unless listed above.  Physical Examination: BP 117/71 (BP Location: Right Arm, Patient Position: Sitting, Cuff Size: Normal)   Pulse (!) 52   Temp 97.8 F (36.6 C) (Oral)   Resp 16  Ht 5' 4.75" (1.645 m)   Wt 146 lb 6.4 oz (66.4 kg)   LMP 02/06/2017   SpO2 100%   BMI 24.55 kg/m  Ideal Body Weight: Weight in (lb) to have BMI = 25: 148.8  Physical Exam  Constitutional: She is oriented to person, place, and time. She appears well-developed and well-nourished. No distress.  HENT:  Head: Normocephalic and atraumatic.  Right Ear: Tympanic membrane, external ear and ear canal normal.  Left Ear: Tympanic membrane, external ear and ear canal normal.  Nose: Right sinus exhibits no maxillary sinus tenderness and no frontal sinus tenderness. Left sinus exhibits no maxillary sinus tenderness and no frontal sinus tenderness.  Mouth/Throat: Oropharynx is clear and moist. No uvula  swelling. No oropharyngeal exudate, posterior oropharyngeal edema or posterior oropharyngeal erythema.  Eyes: Conjunctivae and EOM are normal. Pupils are equal, round, and reactive to light.  Neck: Normal range of motion. Neck supple. No thyromegaly present.  Cardiovascular: Normal rate, regular rhythm, normal heart sounds and intact distal pulses.  Exam reveals no gallop, no distant heart sounds and no friction rub.   No murmur heard. Pulmonary/Chest: Effort normal and breath sounds normal. No respiratory distress. She has no decreased breath sounds. She has no wheezes. She has no rhonchi.  Abdominal: Soft. Bowel sounds are normal. She exhibits no distension and no mass. There is no tenderness.  Genitourinary: Vagina normal and uterus normal. Pelvic exam was performed with patient supine. There is no rash on the right labia. There is no rash on the left labia. Cervix exhibits no motion tenderness, no discharge and no friability. Right adnexum displays no mass. Left adnexum displays no mass.  Musculoskeletal: Normal range of motion. She exhibits no edema or tenderness.  Lymphadenopathy:       Head (right side): No submandibular, no tonsillar, no preauricular and no posterior auricular adenopathy present.       Head (left side): No submandibular, no tonsillar, no preauricular and no posterior auricular adenopathy present.    She has no cervical adenopathy.  Neurological: She is alert and oriented to person, place, and time. No cranial nerve deficit. She exhibits normal muscle tone. Coordination normal.  Skin: Skin is warm and dry. She is not diaphoretic.  Non-raised annular lesions with central clearing.  Ring is widened, with blanching vessels.  Psychiatric: She has a normal mood and affect. Her behavior is normal.     Assessment and Plan: Candas Deemer is a 31 y.o. female who is here today for cc of physical exam. Annual physical exam - Plan: CBC, CMP14+EGFR, TSH, Pap IG w/ reflex to HPV  when ASC-U  Dysuria - Plan: POCT urinalysis dipstick, POCT Wet + KOH Prep, POCT Microscopic Urinalysis ([facility]), Urine Culture, nitrofurantoin, macrocrystal-monohydrate, (MACROBID) 100 MG capsule, metroNIDAZOLE (FLAGYL) 500 MG tablet, CANCELED: Urine Culture  Screening for deficiency anemia - Plan: CBC  Screening for metabolic disorder - Plan: CMP14+EGFR  Screening for thyroid disorder - Plan: TSH  Screening for cervical cancer - Plan: Pap IG w/ reflex to HPV when ASC-U  Ivar Drape, PA-C [facility] 6/18/20187:01 AM

## 2017-03-20 NOTE — Patient Instructions (Addendum)
Bacterial Vaginosis Bacterial vaginosis is an infection of the vagina. It happens when too many germs (bacteria) grow in the vagina. This infection puts you at risk for infections from sex (STIs). Treating this infection can lower your risk for some STIs. You should also treat this if you are pregnant. It can cause your baby to be born early. Follow these instructions at home: Medicines  Take over-the-counter and prescription medicines only as told by your doctor.  Take or use your antibiotic medicine as told by your doctor. Do not stop taking or using it even if you start to feel better. General instructions  If you your sexual partner is a woman, tell her that you have this infection. She needs to get treatment if she has symptoms. If you have a female partner, he does not need to be treated.  During treatment: ? Avoid sex. ? Do not douche. ? Avoid alcohol as told. ? Avoid breastfeeding as told.  Drink enough fluid to keep your pee (urine) clear or pale yellow.  Keep your vagina and butt (rectum) clean. ? Wash the area with warm water every day. ? Wipe from front to back after you use the toilet.  Keep all follow-up visits as told by your doctor. This is important. Preventing this condition  Do not douche.  Use only warm water to wash around your vagina.  Use protection when you have sex. This includes: ? Latex condoms. ? Dental dams.  Limit how many people you have sex with. It is best to only have sex with the same person (be monogamous).  Get tested for STIs. Have your partner get tested.  Wear underwear that is cotton or lined with cotton.  Avoid tight pants and pantyhose. This is most important in summer.  Do not use any products that have nicotine or tobacco in them. These include cigarettes and e-cigarettes. If you need help quitting, ask your doctor.  Do not use illegal drugs.  Limit how much alcohol you drink. Contact a doctor if:  Your symptoms do not get  better, even after you are treated.  You have more discharge or pain when you pee (urinate).  You have a fever.  You have pain in your belly (abdomen).  You have pain with sex.  Your bleed from your vagina between periods. Summary  This infection happens when too many germs (bacteria) grow in the vagina.  Treating this condition can lower your risk for some infections from sex (STIs).  You should also treat this if you are pregnant. It can cause early (premature) birth.  Do not stop taking or using your antibiotic medicine even if you start to feel better. This information is not intended to replace advice given to you by your health care provider. Make sure you discuss any questions you have with your health care provider. Document Released: 07/01/2008 Document Revised: 06/07/2016 Document Reviewed: 06/07/2016 Elsevier Interactive Patient Education  2017 [employer]  Keeping You Healthy  Get These Tests 1. Blood Pressure- Have your blood pressure checked once a year by your health care provider.  Normal blood pressure is 120/80. 2. Weight- Have your body mass index (BMI) calculated to screen for obesity.  BMI is measure of body fat based on height and weight.  You can also calculate your own BMI at https://www.west-esparza.com/www.nhlbisupport.com/bmi/. 3. Cholesterol- Have your cholesterol checked every 5 years starting at age 30 then yearly starting at age 30. 4. Chlamydia, HIV, and other sexually transmitted diseases- Get screened every  year until age 76, then within three months of each new sexual provider. 5. Pap Test - Every 1-5 years; discuss with your health care provider. 6. Mammogram- Every 1-2 years starting at age 62--50  Take these medicines  Calcium with Vitamin D-Your body needs 1200 mg of Calcium each day and [phone] IU of Vitamin D daily.  Your body can only absorb 500 mg of Calcium at a time so Calcium must be taken in 2 or 3 divided doses throughout the day.  Multivitamin with folic  acid- Once daily if it is possible for you to become pregnant.  Get these Immunizations  Gardasil-Series of three doses; prevents HPV related illness such as genital warts and cervical cancer.  Menactra-Single dose; prevents meningitis.  Tetanus shot- Every 10 years.  Flu shot-Every year.  Take these steps 1. Do not smoke-Your healthcare provider can help you quit.  For tips on how to quit go to www.smokefree.gov or call 1-800 QUITNOW. 2. Be physically active- Exercise 5 days a week for at least 30 minutes.  If you are not already physically active, start slow and gradually work up to 30 minutes of moderate physical activity.  Examples of moderate activity include walking briskly, dancing, swimming, bicycling, etc. 3. Breast Cancer- A self breast exam every month is important for early detection of breast cancer.  For more information and instruction on self breast exams, ask your healthcare provider or [place].es. 4. Eat a healthy diet- Eat a variety of healthy foods such as fruits, vegetables, whole grains, low fat milk, low fat cheeses, yogurt, lean meats, poultry and fish, beans, nuts, tofu, etc.  For more information go to www. Thenutritionsource.org 5. Drink alcohol in moderation- Limit alcohol intake to one drink or less per day. Never drink and drive. 6. Depression- Your emotional health is as important as your physical health.  If you're feeling down or losing interest in things you normally enjoy please talk to your healthcare provider about being screened for depression. 7. Dental visit- Brush and floss your teeth twice daily; visit your dentist twice a year. 8. Eye doctor- Get an eye exam at least every 2 years. 9. Helmet use- Always wear a helmet when riding a bicycle, motorcycle, rollerblading or skateboarding. 10. Safe sex- If you may be exposed to sexually transmitted infections, use a condom. 11. Seat belts- Seat belts can save your live;  always wear one. 12. Smoke/Carbon Monoxide detectors- These detectors need to be installed on the appropriate level of your home. Replace batteries at least once a year. 13. Skin cancer- When out in the sun please cover up and use sunscreen 15 SPF or higher. 14. Violence- If anyone is threatening or hurting you, please tell your healthcare provider.           IF you received an x-ray today, you will receive an invoice from [facility]. Please contact [facility] at [phone] with questions or concerns regarding your invoice.   IF you received labwork today, you will receive an invoice from [facility]. Please contact [facility] at [phone] with questions or concerns regarding your invoice.   Our billing staff will not be able to assist you with questions regarding bills from these companies.  You will be contacted with the lab results as soon as they are available. The fastest way to get your results is to activate your My Chart account. Instructions are located on the last page of this paperwork. If you have not heard from [place] regarding the results  in 2 weeks, please contact this office.    We recommend that you schedule a mammogram for breast cancer screening. Typically, you do not need a referral to do this. Please contact a local imaging center to schedule your mammogram.  [facility] - [phone]  *ask for the Radiology Department [facility]) - [phone] or [phone]  [facility] - [phone] [facility] - [phone] [facility] - [phone]  *ask for the Radiology Department [facility] - [phone]  *ask for the Radiology Department [facility] - [phone]  *ask for the Mammography Department [facility] - [phone]

## 2017-03-21 LAB — CBC
Hematocrit: 46.4 % (ref 34.0–46.6)
Hemoglobin: 15.4 g/dL (ref 11.1–15.9)
MCH: 31.2 pg (ref 26.6–33.0)
MCHC: 33.2 g/dL (ref 31.5–35.7)
MCV: 94 fL (ref 79–97)
Platelets: 232 10*3/uL (ref 150–379)
RBC: 4.93 x10E6/uL (ref 3.77–5.28)
RDW: 13 % (ref 12.3–15.4)
WBC: 5.8 10*3/uL (ref 3.4–10.8)

## 2017-03-21 LAB — CMP14+EGFR
ALK PHOS: 54 IU/L (ref 39–117)
ALT: 31 IU/L (ref 0–32)
AST: 22 IU/L (ref 0–40)
Albumin/Globulin Ratio: 2 (ref 1.2–2.2)
Albumin: 4.7 g/dL (ref 3.5–5.5)
BUN/Creatinine Ratio: 15 (ref 9–23)
BUN: 11 mg/dL (ref 6–20)
Bilirubin Total: 0.9 mg/dL (ref 0.0–1.2)
CO2: 25 mmol/L (ref 20–29)
CREATININE: 0.74 mg/dL (ref 0.57–1.00)
Calcium: 9.9 mg/dL (ref 8.7–10.2)
Chloride: 100 mmol/L (ref 96–106)
GFR calc Af Amer: 126 mL/min/{1.73_m2} (ref >59)
GFR calc non Af Amer: 109 mL/min/{1.73_m2} (ref >59)
Globulin, Total: 2.3 g/dL (ref 1.5–4.5)
Glucose: 94 mg/dL (ref 65–99)
Potassium: 4.5 mmol/L (ref 3.5–5.2)
Sodium: 139 mmol/L (ref 134–144)
Total Protein: 7 g/dL (ref 6.0–8.5)

## 2017-03-21 LAB — URINE CULTURE

## 2017-03-21 LAB — TSH: TSH: 1.3 u[IU]/mL (ref 0.450–4.500)

## 2017-03-23 LAB — PAP IG W/ RFLX HPV ASCU: PAP SMEAR COMMENT: 0

## 2017-03-24 ENCOUNTER — Other Ambulatory Visit: Payer: Self-pay | Admitting: Physician Assistant

## 2017-03-24 DIAGNOSIS — K439 Ventral hernia without obstruction or gangrene: Secondary | ICD-10-CM

## 2017-03-24 DIAGNOSIS — R1033 Periumbilical pain: Secondary | ICD-10-CM

## 2017-04-23 DIAGNOSIS — K439 Ventral hernia without obstruction or gangrene: Secondary | ICD-10-CM | POA: Diagnosis not present

## 2017-04-27 ENCOUNTER — Encounter: Payer: Self-pay | Admitting: Physician Assistant

## 2017-04-27 DIAGNOSIS — K439 Ventral hernia without obstruction or gangrene: Secondary | ICD-10-CM | POA: Insufficient documentation

## 2017-04-29 ENCOUNTER — Encounter: Payer: Self-pay | Admitting: Physician Assistant

## 2017-04-29 ENCOUNTER — Other Ambulatory Visit: Payer: Self-pay | Admitting: General Surgery

## 2017-04-29 ENCOUNTER — Ambulatory Visit (INDEPENDENT_AMBULATORY_CARE_PROVIDER_SITE_OTHER): Payer: BLUE CROSS/BLUE SHIELD | Admitting: Physician Assistant

## 2017-04-29 VITALS — BP 112/61 | HR 65 | Temp 98.5°F | Resp 17 | Ht 65.5 in | Wt 153.0 lb

## 2017-04-29 DIAGNOSIS — K439 Ventral hernia without obstruction or gangrene: Secondary | ICD-10-CM

## 2017-04-29 DIAGNOSIS — H6123 Impacted cerumen, bilateral: Secondary | ICD-10-CM | POA: Diagnosis not present

## 2017-04-29 NOTE — Patient Instructions (Signed)
     IF you received an x-ray today, you will receive an invoice from [facility]. Please contact [facility] at [phone] with questions or concerns regarding your invoice.   IF you received labwork today, you will receive an invoice from [facility]. Please contact [facility] at [phone] with questions or concerns regarding your invoice.   Our billing staff will not be able to assist you with questions regarding bills from these companies.  You will be contacted with the lab results as soon as they are available. The fastest way to get your results is to activate your My Chart account. Instructions are located on the last page of this paperwork. If you have not heard from us regarding the results in 2 weeks, please contact this office.     

## 2017-05-01 ENCOUNTER — Ambulatory Visit
Admission: RE | Admit: 2017-05-01 | Discharge: 2017-05-01 | Disposition: A | Discharge status: Home / Self Care | Payer: BLUE CROSS/BLUE SHIELD | Source: Ambulatory Visit | Attending: General Surgery | Admitting: General Surgery

## 2017-05-01 DIAGNOSIS — K4090 Unilateral inguinal hernia, without obstruction or gangrene, not specified as recurrent: Secondary | ICD-10-CM | POA: Diagnosis not present

## 2017-05-01 DIAGNOSIS — K439 Ventral hernia without obstruction or gangrene: Secondary | ICD-10-CM

## 2017-05-01 MED ORDER — IOPAMIDOL (ISOVUE-300) INJECTION 61%
100.0000 mL | Freq: Once | INTRAVENOUS | Status: AC | PRN
  Administered 2017-05-01: 100 mL via INTRAVENOUS

## 2017-05-07 NOTE — Progress Notes (Signed)
[facility] [address], [place] [phone]  Date:  04/29/2017   Name:  Amanda GlassSandra Jarvis   DOB:  1986-03-09   MRN:  [id number]  PCP:  Garnetta BuddyEnglish, Deryck Hippler D, PA    History of Present Illness:  Amanda Jarvis is a 31 y.o. female patient who presents to PCP with  Chief Complaint  Patient presents with  . hear loss right side     Hearing loss of right side, and sensation of clogged.  She notes that she has had some discomfort.  No ur symptoms.   Patient Active Problem List   Diagnosis Date Noted  . Ventral hernia without obstruction or gangrene 04/27/2017    Past Medical History:  Diagnosis Date  . Anemia    with 1st pregnancy  . Medical history non-contributory   . Sickle cell trait [facility](HCC)     Past Surgical History:  Procedure Laterality Date  . NO PAST SURGERIES    . TUBAL LIGATION Bilateral 07/24/2013   Procedure: POST PARTUM TUBAL LIGATION;  Surgeon: Turner Danielsavid C Lowe, MD;  Location: [facility];  Service: Gynecology;  Laterality: Bilateral;    Social History  Substance Use Topics  . Smoking status: Current Some Day Smoker  . Smokeless tobacco: Never Used  . Alcohol use 2.4 oz/week    4 Glasses of wine per week    Family History  Problem Relation Age of Onset  . Hyperlipidemia Maternal Grandmother   . Stroke Paternal Grandfather     No Known Allergies  Medication list has been reviewed and updated.  No current outpatient prescriptions on file prior to visit.   No current facility-administered medications on file prior to visit.     ROS ROS otherwise unremarkable unless listed above.  Physical Examination: BP 112/61   Pulse 65   Temp 98.5 F (36.9 C) (Oral)   Resp 17   Ht 5' 5.5" (1.664 m)   Wt 153 lb (69.4 kg)   LMP 04/04/2017   SpO2 98%   BMI 25.07 kg/m  Ideal Body Weight: Weight in (lb) to have BMI = 25: 152.2  Physical Exam  Constitutional: She is oriented to person, place, and time. She appears well-developed and  well-nourished. No distress.  HENT:  Head: Normocephalic and atraumatic.  Right Ear: External ear normal.  Left Ear: External ear normal.  Bilateral cerumen impaction  Eyes: Pupils are equal, round, and reactive to light. Conjunctivae and EOM are normal.  Cardiovascular: Normal rate.   Pulmonary/Chest: Effort normal. No respiratory distress.  Neurological: She is alert and oriented to person, place, and time.  Skin: She is not diaphoretic.  Psychiatric: She has a normal mood and affect. Her behavior is normal.     Assessment and Plan: Amanda Jarvis is a 31 y.o. female who is here today for ear discomfort. Hearing loss due to cerumen impaction, bilateral  Trena PlattStephanie Amanda Bernardi, PA-C [facility] 8/2/20187:32 AM

## 2017-05-08 NOTE — Progress Notes (Signed)
Place orders in epic for 8-23 surgery please

## 2017-05-15 ENCOUNTER — Ambulatory Visit: Payer: Self-pay | Admitting: General Surgery

## 2017-05-22 NOTE — Patient Instructions (Signed)
Amanda Jarvis  05/22/2017   Your procedure is scheduled on: 05-28-17  Report to [facility] Main  Entrance Take [facility]  elevators to 3rd floor to  Short Stay Center at 730AM.   Call this number if you have problems the morning of surgery [phone]   Remember: ONLY 1 PERSON MAY GO WITH YOU TO SHORT STAY TO GET  READY MORNING OF YOUR SURGERY.  Do not eat food or drink liquids :After Midnight.     Take these medicines the morning of surgery with A SIP OF WATER: none                                You may not have any metal on your body including hair pins and              piercings  Do not wear jewelry, make-up, lotions, powders or perfumes, deodorant             Do not wear nail polish.  Do not shave  48 hours prior to surgery.               Do not bring valuables to the hospital. [facility] IS NOT             RESPONSIBLE   FOR VALUABLES.  Contacts, dentures or bridgework may not be worn into surgery.  Leave suitcase in the car. After surgery it may be brought to your room.                Please read over the following fact sheets you were given: _____________________________________________________________________             [facility] - Preparing for Surgery Before surgery, you can play an important role.  Because skin is not sterile, your skin needs to be as free of germs as possible.  You can reduce the number of germs on your skin by washing with CHG (chlorahexidine gluconate) soap before surgery.  CHG is an antiseptic cleaner which kills germs and bonds with the skin to continue killing germs even after washing. Please DO NOT use if you have an allergy to CHG or antibacterial soaps.  If your skin becomes reddened/irritated stop using the CHG and inform your nurse when you arrive at Short Stay. Do not shave (including legs and underarms) for at least 48 hours prior to the first CHG shower.  You may shave your face/neck. Please follow these  instructions carefully:  1.  Shower with CHG Soap the night before surgery and the  morning of Surgery.  2.  If you choose to wash your hair, wash your hair first as usual with your  normal  shampoo.  3.  After you shampoo, rinse your hair and body thoroughly to remove the  shampoo.                           4.  Use CHG as you would any other liquid soap.  You can apply chg directly  to the skin and wash                       Gently with a scrungie or clean washcloth.  5.  Apply the CHG Soap to your body ONLY FROM THE NECK DOWN.  Do not use on face/ open                           Wound or open sores. Avoid contact with eyes, ears mouth and genitals (private parts).                       Wash face,  Genitals (private parts) with your normal soap.             6.  Wash thoroughly, paying special attention to the area where your surgery  will be performed.  7.  Thoroughly rinse your body with warm water from the neck down.  8.  DO NOT shower/wash with your normal soap after using and rinsing off  the CHG Soap.                9.  Pat yourself dry with a clean towel.            10.  Wear clean pajamas.            11.  Place clean sheets on your bed the night of your first shower and do not  sleep with pets. Day of Surgery : Do not apply any lotions/deodorants the morning of surgery.  Please wear clean clothes to the hospital/surgery center.  FAILURE TO FOLLOW THESE INSTRUCTIONS MAY RESULT IN THE CANCELLATION OF YOUR SURGERY PATIENT SIGNATURE_________________________________  NURSE SIGNATURE__________________________________  ________________________________________________________________________

## 2017-05-25 ENCOUNTER — Encounter (HOSPITAL_COMMUNITY)
Admission: RE | Admit: 2017-05-25 | Discharge: 2017-05-25 | Disposition: A | Discharge status: Home / Self Care | Payer: BLUE CROSS/BLUE SHIELD | Source: Ambulatory Visit | Attending: General Surgery | Admitting: General Surgery

## 2017-06-19 ENCOUNTER — Encounter (HOSPITAL_COMMUNITY): Payer: BLUE CROSS/BLUE SHIELD

## 2017-06-22 ENCOUNTER — Encounter (HOSPITAL_COMMUNITY)
Admission: RE | Admit: 2017-06-22 | Discharge: 2017-06-22 | Disposition: A | Discharge status: Home / Self Care | Payer: BLUE CROSS/BLUE SHIELD | Source: Ambulatory Visit | Attending: General Surgery | Admitting: General Surgery

## 2017-06-22 NOTE — Patient Instructions (Signed)
Amanda Jarvis  06/22/2017   Your procedure is scheduled on: 06/25/17  Report to [facility] Main  Entrance Take [place]  elevators to 3rd floor to  Short Stay Center at     1130AM.    Call this number if you have problems the morning of surgery [phone]    Remember: ONLY 1 PERSON MAY GO WITH YOU TO SHORT STAY TO GET  READY MORNING OF YOUR SURGERY.  Do not eat food:After Midnight.you may have clear liquids until 0730 am then nothing by mouth     Take these medicines the morning of surgery with A SIP OF WATER: NONE                                You may not have any metal on your body including hair pins and              piercings  Do not wear jewelry, make-up, lotions, powders or perfumes, deodorant             Do not wear nail polish.  Do not shave  48 hours prior to surgery.     Do not bring valuables to the hospital. [facility] IS NOT             RESPONSIBLE   FOR VALUABLES.  Contacts, dentures or bridgework may not be worn into surgery.  Leave suitcase in the car. After surgery it may be brought to your room.                Please read over the following fact sheets you were given: _____________________________________________________________________         [facility] - Preparing for Surgery Before surgery, you can play an important role.  Because skin is not sterile, your skin needs to be as free of germs as possible.  You can reduce the number of germs on your skin by washing with CHG (chlorahexidine gluconate) soap before surgery.  CHG is an antiseptic cleaner which kills germs and bonds with the skin to continue killing germs even after washing. Please DO NOT use if you have an allergy to CHG or antibacterial soaps.  If your skin becomes reddened/irritated stop using the CHG and inform your nurse when you arrive at Short Stay. Do not shave (including legs and underarms) for at least 48 hours prior to the first CHG shower.  You may shave your  face/neck. Please follow these instructions carefully:  1.  Shower with CHG Soap the night before surgery and the  morning of Surgery.  2.  If you choose to wash your hair, wash your hair first as usual with your  normal  shampoo.  3.  After you shampoo, rinse your hair and body thoroughly to remove the  shampoo.                           4.  Use CHG as you would any other liquid soap.  You can apply chg directly  to the skin and wash                       Gently with a scrungie or clean washcloth.  5.  Apply the CHG Soap to your body ONLY FROM THE NECK DOWN.   Do  not use on face/ open                           Wound or open sores. Avoid contact with eyes, ears mouth and genitals (private parts).                       Wash face,  Genitals (private parts) with your normal soap.             6.  Wash thoroughly, paying special attention to the area where your surgery  will be performed.  7.  Thoroughly rinse your body with warm water from the neck down.  8.  DO NOT shower/wash with your normal soap after using and rinsing off  the CHG Soap.                9.  Pat yourself dry with a clean towel.            10.  Wear clean pajamas.            11.  Place clean sheets on your bed the night of your first shower and do not  sleep with pets. Day of Surgery : Do not apply any lotions/deodorants the morning of surgery.  Please wear clean clothes to the hospital/surgery center.  FAILURE TO FOLLOW THESE INSTRUCTIONS MAY RESULT IN THE CANCELLATION OF YOUR SURGERY PATIENT SIGNATURE_________________________________  NURSE SIGNATURE__________________________________  ________________________________________________________________________    CLEAR LIQUID DIET  Till 0730 then nothing by mouth   Foods Allowed                                                                     Foods Excluded  Coffee and tea, regular and decaf                             liquids that you cannot  Plain Jell-O in any  flavor                                             see through such as: Fruit ices (not with fruit pulp)                                     milk, soups, orange juice  Iced Popsicles                                    All solid food Carbonated beverages, regular and diet                                    Cranberry, grape and apple juices Sports drinks like Gatorade Lightly seasoned clear broth or consume(fat free) Sugar, honey syrup   _____________________________________________________________________

## 2017-07-29 NOTE — Patient Instructions (Signed)
Amanda Jarvis  07/29/2017   Your procedure is scheduled on: Thursday, Nov. 2, 2018   Report to [facility] Main  Entrance   Take [place]  elevators to 3rd floor to  Short Stay Center at 5:30 AM.    Call this number if you have problems the morning of surgery [phone]    Remember: ONLY 1 PERSON MAY GO WITH YOU TO SHORT STAY TO GET  READY MORNING OF YOUR SURGERY.   Do not eat food or drink liquids :After Midnight.    Take these medicines the morning of surgery with A SIP OF WATER: None                               You may not have any metal on your body including hair pins and              piercings    Do not wear jewelry, make-up, lotions, powders or perfumes, deodorant             Do not wear nail polish.    Do not shave  48 hours prior to surgery.               Do not bring valuables to the hospital. [facility] IS NOT             RESPONSIBLE   FOR VALUABLES.   Contacts, dentures or bridgework may not be worn into surgery.   Leave suitcase in the car. After surgery it may be brought to your room.              Please read over the following fact sheets you were given: _____________________________________________________________________             [facility] - Preparing for Surgery Before surgery, you can play an important role.  Because skin is not sterile, your skin needs to be as free of germs as possible.  You can reduce the number of germs on your skin by washing with CHG (chlorahexidine gluconate) soap before surgery.  CHG is an antiseptic cleaner which kills germs and bonds with the skin to continue killing germs even after washing. Please DO NOT use if you have an allergy to CHG or antibacterial soaps.  If your skin becomes reddened/irritated stop using the CHG and inform your nurse when you arrive at Short Stay. Do not shave (including legs and underarms) for at least 48 hours prior to the first CHG shower.  You may shave your  face/neck.  Please follow these instructions carefully:  1.  Shower with CHG Soap the night before surgery and the  morning of Surgery.  2.  If you choose to wash your hair, wash your hair first as usual with your  normal  shampoo.  3.  After you shampoo, rinse your hair and body thoroughly to remove the  shampoo.                             4.  Use CHG as you would any other liquid soap.  You can apply chg directly  to the skin and wash                       Gently with a scrungie or clean washcloth.  5.  Apply the CHG Soap to your body ONLY FROM THE NECK DOWN.   Do not use on face/ open                           Wound or open sores. Avoid contact with eyes, ears mouth and genitals (private parts).                       Wash face,  Genitals (private parts) with your normal soap.             6.  Wash thoroughly, paying special attention to the area where your surgery  will be performed.  7.  Thoroughly rinse your body with warm water from the neck down.  8.  DO NOT shower/wash with your normal soap after using and rinsing off  the CHG Soap.                9.  Pat yourself dry with a clean towel.            10.  Wear clean pajamas.            11.  Place clean sheets on your bed the night of your first shower and do not  sleep with pets. Day of Surgery : Do not apply any lotions/deodorants the morning of surgery.  Please wear clean clothes to the hospital/surgery center.  FAILURE TO FOLLOW THESE INSTRUCTIONS MAY RESULT IN THE CANCELLATION OF YOUR SURGERY  PATIENT SIGNATURE_________________________________  NURSE SIGNATURE__________________________________  ________________________________________________________________________   Amanda MireIncentive Spirometer  An incentive spirometer is a tool that can help keep your lungs clear and active. This tool measures how well you are filling your lungs with each breath. Taking long deep breaths may help reverse or decrease the chance of developing  breathing (pulmonary) problems (especially infection) following:  A long period of time when you are unable to move or be active. BEFORE THE PROCEDURE   If the spirometer includes an indicator to show your best effort, your nurse or respiratory therapist will set it to a desired goal.  If possible, sit up straight or lean slightly forward. Try not to slouch.  Hold the incentive spirometer in an upright position. INSTRUCTIONS FOR USE  1. Sit on the edge of your bed if possible, or sit up as far as you can in bed or on a chair. 2. Hold the incentive spirometer in an upright position. 3. Breathe out normally. 4. Place the mouthpiece in your mouth and seal your lips tightly around it. 5. Breathe in slowly and as deeply as possible, raising the piston or the ball toward the top of the column. 6. Hold your breath for 3-5 seconds or for as long as possible. Allow the piston or ball to fall to the bottom of the column. 7. Remove the mouthpiece from your mouth and breathe out normally. 8. Rest for a few seconds and repeat Steps 1 through 7 at least 10 times every 1-2 hours when you are awake. Take your time and take a few normal breaths between deep breaths. 9. The spirometer may include an indicator to show your best effort. Use the indicator as a goal to work toward during each repetition. 10. After each set of 10 deep breaths, practice coughing to be sure your lungs are clear. If you have an incision (the cut made at the time of surgery), support your incision when coughing by placing a pillow  or rolled up towels firmly against it. Once you are able to get out of bed, walk around indoors and cough well. You may stop using the incentive spirometer when instructed by your caregiver.  RISKS AND COMPLICATIONS  Take your time so you do not get dizzy or light-headed.  If you are in pain, you may need to take or ask for pain medication before doing incentive spirometry. It is harder to take a deep  breath if you are having pain. AFTER USE  Rest and breathe slowly and easily.  It can be helpful to keep track of a log of your progress. Your caregiver can provide you with a simple table to help with this. If you are using the spirometer at home, follow these instructions: SEEK MEDICAL CARE IF:   You are having difficultly using the spirometer.  You have trouble using the spirometer as often as instructed.  Your pain medication is not giving enough relief while using the spirometer.  You develop fever of 100.5 F (38.1 C) or higher. SEEK IMMEDIATE MEDICAL CARE IF:   You cough up bloody sputum that had not been present before.  You develop fever of 102 F (38.9 C) or greater.  You develop worsening pain at or near the incision site. MAKE SURE YOU:   Understand these instructions.  Will watch your condition.  Will get help right away if you are not doing well or get worse. Document Released: 02/02/2007 Document Revised: 12/15/2011 Document Reviewed: 04/05/2007 [facility] Patient Information 2014 [place], [place].   ________________________________________________________________________

## 2017-07-29 NOTE — Pre-Procedure Instructions (Signed)
The following are in epic Last office visit 04/29/17 CT abd/pelvis 05/01/17

## 2017-07-31 ENCOUNTER — Encounter (HOSPITAL_COMMUNITY)
Admission: RE | Admit: 2017-07-31 | Discharge: 2017-07-31 | Disposition: A | Discharge status: Home / Self Care | Payer: BLUE CROSS/BLUE SHIELD | Source: Ambulatory Visit | Attending: General Surgery | Admitting: General Surgery

## 2017-08-07 ENCOUNTER — Encounter (HOSPITAL_COMMUNITY): Admission: RE | Payer: Self-pay | Source: Ambulatory Visit

## 2017-08-07 ENCOUNTER — Ambulatory Visit (HOSPITAL_COMMUNITY)
Admission: RE | Admit: 2017-08-07 | Payer: BLUE CROSS/BLUE SHIELD | Source: Ambulatory Visit | Admitting: General Surgery

## 2017-08-07 SURGERY — REPAIR, HERNIA, VENTRAL, LAPAROSCOPY-ASSISTED
Anesthesia: General

## 2018-01-13 ENCOUNTER — Encounter: Payer: Self-pay | Admitting: Physician Assistant

## 2019-03-12 ENCOUNTER — Encounter (HOSPITAL_COMMUNITY): Payer: Self-pay

## 2019-03-12 ENCOUNTER — Other Ambulatory Visit: Payer: Self-pay

## 2019-03-12 ENCOUNTER — Emergency Department (HOSPITAL_COMMUNITY)
Admission: EM | Admit: 2019-03-12 | Discharge: 2019-03-13 | Disposition: A | Discharge status: Home / Self Care | Payer: BLUE CROSS/BLUE SHIELD | Attending: Emergency Medicine | Admitting: Emergency Medicine

## 2019-03-12 DIAGNOSIS — Y9355 Activity, bike riding: Secondary | ICD-10-CM | POA: Insufficient documentation

## 2019-03-12 DIAGNOSIS — Y998 Other external cause status: Secondary | ICD-10-CM | POA: Insufficient documentation

## 2019-03-12 DIAGNOSIS — F17210 Nicotine dependence, cigarettes, uncomplicated: Secondary | ICD-10-CM | POA: Insufficient documentation

## 2019-03-12 DIAGNOSIS — S81811A Laceration without foreign body, right lower leg, initial encounter: Secondary | ICD-10-CM | POA: Insufficient documentation

## 2019-03-12 DIAGNOSIS — Y92838 Other recreation area as the place of occurrence of the external cause: Secondary | ICD-10-CM | POA: Insufficient documentation

## 2019-03-12 MED ORDER — LIDOCAINE-EPINEPHRINE (PF) 2 %-1:200000 IJ SOLN
10.0000 mL | Freq: Once | INTRAMUSCULAR | Status: AC
  Administered 2019-03-12: 10 mL
  Filled 2019-03-12: qty 10

## 2019-03-12 MED ORDER — BACITRACIN ZINC 500 UNIT/GM EX OINT
TOPICAL_OINTMENT | Freq: Once | CUTANEOUS | Status: AC
  Administered 2019-03-13: 1 via TOPICAL
  Filled 2019-03-12: qty 0.9
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
## 2019-03-12 NOTE — ED Notes (Signed)
Laceration tray and suture cart at bedside.

## 2019-03-12 NOTE — Discharge Instructions (Signed)
It was my pleasure taking care of you today!   Keep wound clean with mild soap and water. Keep area covered with a topical antibiotic ointment and bandage, keep bandage dry. Ice for additional pain relief and swelling. Alternate between ibuprofen and Tylenol for additional pain relief. Follow up with your primary care doctor, [facility] or ER in approximately 10 days for wound recheck and suture removal. Monitor area for signs of infection to include, but not limited to: increasing pain, spreading redness, drainage/pus, worsening swelling, or fevers. Return to emergency department for emergent changing or worsening symptoms.   WOUND CARE Keep area clean and dry for 24 hours. Do not remove bandage, if applied. After 24 hours,you should change it at least once a day. Also, change the dressing if it becomes wet or dirty, or as directed by your caregiver.  Wash the wound with soap and water 2 times a day. Rinse the wound off with water to remove all soap. Pat the wound dry with a clean towel.  You may shower as usual after the first 24 hours. Do not soak the wound in water until the sutures are removed.  Return if you experience any of the following signs of infection: Swelling, redness, pus drainage, streaking, fever >100.4 F Return if you experience excessive bleeding that does not stop after 15-20 minutes of constant, firm pressure.
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
## 2019-08-15 DIAGNOSIS — M62838 Other muscle spasm: Secondary | ICD-10-CM | POA: Diagnosis not present

## 2019-08-15 DIAGNOSIS — M9903 Segmental and somatic dysfunction of lumbar region: Secondary | ICD-10-CM | POA: Diagnosis not present

## 2019-08-15 DIAGNOSIS — M5386 Other specified dorsopathies, lumbar region: Secondary | ICD-10-CM | POA: Diagnosis not present

## 2019-08-15 DIAGNOSIS — M9905 Segmental and somatic dysfunction of pelvic region: Secondary | ICD-10-CM | POA: Diagnosis not present

## 2019-08-22 DIAGNOSIS — M9903 Segmental and somatic dysfunction of lumbar region: Secondary | ICD-10-CM | POA: Diagnosis not present

## 2019-08-22 DIAGNOSIS — M9905 Segmental and somatic dysfunction of pelvic region: Secondary | ICD-10-CM | POA: Diagnosis not present

## 2019-08-22 DIAGNOSIS — M62838 Other muscle spasm: Secondary | ICD-10-CM | POA: Diagnosis not present

## 2019-08-22 DIAGNOSIS — M5386 Other specified dorsopathies, lumbar region: Secondary | ICD-10-CM | POA: Diagnosis not present

## 2020-09-19 DIAGNOSIS — N920 Excessive and frequent menstruation with regular cycle: Secondary | ICD-10-CM | POA: Diagnosis not present

## 2020-09-19 DIAGNOSIS — Z01419 Encounter for gynecological examination (general) (routine) without abnormal findings: Secondary | ICD-10-CM | POA: Diagnosis not present

## 2020-09-19 DIAGNOSIS — Z124 Encounter for screening for malignant neoplasm of cervix: Secondary | ICD-10-CM | POA: Diagnosis not present

## 2020-09-20 DIAGNOSIS — Z124 Encounter for screening for malignant neoplasm of cervix: Secondary | ICD-10-CM | POA: Diagnosis not present

## 2020-09-21 ENCOUNTER — Other Ambulatory Visit: Payer: Self-pay | Admitting: Obstetrics and Gynecology

## 2020-09-21 DIAGNOSIS — N920 Excessive and frequent menstruation with regular cycle: Secondary | ICD-10-CM

## 2020-10-03 ENCOUNTER — Other Ambulatory Visit: Payer: Self-pay | Admitting: Obstetrics and Gynecology

## 2020-10-03 ENCOUNTER — Other Ambulatory Visit: Payer: Self-pay

## 2020-10-03 DIAGNOSIS — N920 Excessive and frequent menstruation with regular cycle: Secondary | ICD-10-CM

## 2020-10-19 ENCOUNTER — Ambulatory Visit
Admission: RE | Admit: 2020-10-19 | Discharge: 2020-10-19 | Disposition: A | Discharge status: Home / Self Care | Payer: Medicaid Other | Source: Ambulatory Visit | Attending: Obstetrics and Gynecology | Admitting: Obstetrics and Gynecology

## 2020-10-19 DIAGNOSIS — N920 Excessive and frequent menstruation with regular cycle: Secondary | ICD-10-CM

## 2020-10-26 ENCOUNTER — Ambulatory Visit: Payer: Medicaid Other | Admitting: Family Medicine

## 2020-11-01 ENCOUNTER — Other Ambulatory Visit: Payer: Self-pay

## 2020-11-02 ENCOUNTER — Ambulatory Visit: Payer: BC Managed Care – PPO | Admitting: Family Medicine

## 2020-11-02 ENCOUNTER — Encounter: Payer: Self-pay | Admitting: Family Medicine

## 2020-11-02 VITALS — BP 110/70 | HR 60 | Resp 16 | Ht 65.5 in | Wt 155.0 lb

## 2020-11-02 DIAGNOSIS — H6120 Impacted cerumen, unspecified ear: Secondary | ICD-10-CM

## 2020-11-02 DIAGNOSIS — Z130 Encounter for screening for diseases of the blood and blood-forming organs and certain disorders involving the immune mechanism: Secondary | ICD-10-CM

## 2020-11-02 DIAGNOSIS — N938 Other specified abnormal uterine and vaginal bleeding: Secondary | ICD-10-CM

## 2020-11-02 DIAGNOSIS — Z1329 Encounter for screening for other suspected endocrine disorder: Secondary | ICD-10-CM

## 2020-11-02 DIAGNOSIS — Z0000 Encounter for general adult medical examination without abnormal findings: Secondary | ICD-10-CM | POA: Diagnosis not present

## 2020-11-02 DIAGNOSIS — Z1159 Encounter for screening for other viral diseases: Secondary | ICD-10-CM | POA: Diagnosis not present

## 2020-11-02 DIAGNOSIS — Z13220 Encounter for screening for lipoid disorders: Secondary | ICD-10-CM | POA: Diagnosis not present

## 2020-11-02 DIAGNOSIS — R61 Generalized hyperhidrosis: Secondary | ICD-10-CM

## 2020-11-02 DIAGNOSIS — Z13228 Encounter for screening for other metabolic disorders: Secondary | ICD-10-CM

## 2020-11-02 DIAGNOSIS — K439 Ventral hernia without obstruction or gangrene: Secondary | ICD-10-CM | POA: Diagnosis not present

## 2020-11-02 LAB — LIPID PANEL
Cholesterol: 144 mg/dL (ref 0–200)
HDL: 48.7 mg/dL (ref >39.00)
LDL Cholesterol: 65 mg/dL (ref 0–99)
NonHDL: 95.41
Total CHOL/HDL Ratio: 3
Triglycerides: 154 mg/dL — ABNORMAL HIGH (ref 0.0–149.0)
VLDL: 30.8 mg/dL (ref 0.0–40.0)

## 2020-11-02 LAB — COMPREHENSIVE METABOLIC PANEL
ALT: 35 U/L (ref 0–35)
AST: 23 U/L (ref 0–37)
Albumin: 4.5 g/dL (ref 3.5–5.2)
Alkaline Phosphatase: 54 U/L (ref 39–117)
BUN: 12 mg/dL (ref 6–23)
CO2: 30 mEq/L (ref 19–32)
Calcium: 9.6 mg/dL (ref 8.4–10.5)
Chloride: 104 mEq/L (ref 96–112)
Creatinine, Ser: 0.66 mg/dL (ref 0.40–1.20)
GFR: 114.85 mL/min (ref >60.00)
Glucose, Bld: 87 mg/dL (ref 70–99)
Potassium: 4.1 mEq/L (ref 3.5–5.1)
Sodium: 140 mEq/L (ref 135–145)
Total Bilirubin: 1.2 mg/dL (ref 0.2–1.2)
Total Protein: 6.8 g/dL (ref 6.0–8.3)

## 2020-11-02 LAB — CBC
HCT: 42.2 % (ref 36.0–46.0)
Hemoglobin: 14.5 g/dL (ref 12.0–15.0)
MCHC: 34.4 g/dL (ref 30.0–36.0)
MCV: 91.7 fl (ref 78.0–100.0)
Platelets: 207 10*3/uL (ref 150.0–400.0)
RBC: 4.6 Mil/uL (ref 3.87–5.11)
RDW: 12.7 % (ref 11.5–15.5)
WBC: 6.4 10*3/uL (ref 4.0–10.5)

## 2020-11-02 LAB — HEMOGLOBIN A1C: Hgb A1c MFr Bld: 5.3 % (ref 4.6–6.5)

## 2020-11-02 MED ORDER — DEBROX 6.5 % OT SOLN: 5.0000 [drp] | Freq: Two times a day (BID) | OTIC | 0 refills | Status: DC

## 2020-11-02 NOTE — Progress Notes (Signed)
HPI:  [name] is a pleasant 35 y.o. female, who is here today to establish care.  Former PCP: Dr Lenox Ponds Last preventive routine visit: 03/20/17. She would like a CPE today.  Walks her dog 2-3 times per day, about an hour daily. She follows a healthful diet. She lives with her husband and her 3 children.  Chronic medical problems: Otherwise healthy. Occasional smoker. Drinks alcohol occasionally, socially. No hx of DM,HLD,or HTN.  Ventral and umbilical hernia, not symptoms. She was supposed to have hernia repair but she relocated. Profuse sweating during menstrual periods.  Heavy menses. Hx of anemia during pregnancies. M:9 Follows with gyn. Pap smear 09/19/2020.  TSH done in 09/2020 was normal at 1.5.  Tdap 05/03/13.  Review of Systems  Constitutional: Negative for activity change, appetite change, fatigue and fever.  HENT: Negative for mouth sores, nosebleeds, sore throat and trouble swallowing.   Eyes: Negative for redness and visual disturbance.  Respiratory: Negative for cough, shortness of breath and wheezing.   Cardiovascular: Negative for chest pain, palpitations and leg swelling.  Gastrointestinal: Negative for abdominal pain, nausea and vomiting.       Negative for changes in bowel habits.  Genitourinary: Negative for decreased urine volume, hematuria, vaginal bleeding and vaginal discharge.  Musculoskeletal: Negative for gait problem and myalgias.  Skin: Negative for pallor and rash.  Allergic/Immunologic: Negative for environmental allergies.  Neurological: Negative for syncope, weakness and headaches.  Psychiatric/Behavioral: Negative for behavioral problems and confusion.  Rest see pertinent positives and negatives per HPI.  No current outpatient medications on file prior to visit.   No current facility-administered medications on file prior to visit.   Past Medical History:  Diagnosis Date  . Anemia    with 1st pregnancy  .  Medical history non-contributory   . Sickle cell trait (HCC)    No Known Allergies  Family History  Problem Relation Age of Onset  . Hypertension Mother   . Hyperlipidemia Maternal Grandmother   . Stroke Maternal Grandmother   . Stroke Paternal Grandfather   . Heart disease Paternal Grandfather     Social History   Socioeconomic History  . Marital status: Single    Spouse name: Not on file  . Number of children: Not on file  . Years of education: Not on file  . Highest education level: Not on file  Occupational History  . Not on file  Tobacco Use  . Smoking status: Current Some Day Smoker  . Smokeless tobacco: Never Used  Substance and Sexual Activity  . Alcohol use: Yes    Alcohol/week: 4.0 standard drinks    Types: 4 Glasses of wine per week  . Drug use: No  . Sexual activity: Yes    Birth control/protection: None  Other Topics Concern  . Not on file  Social History Narrative  . Not on file   Social Determinants of Health   Financial Resource Strain: Not on file  Food Insecurity: Not on file  Transportation Needs: Not on file  Physical Activity: Not on file  Stress: Not on file  Social Connections: Not on file    Vitals:   11/02/20 1052  BP: 110/70  Pulse: 60  Resp: 16  SpO2: 94%   Body mass index is 25.4 kg/m.  Physical Exam Vitals and nursing note reviewed.  Constitutional:      General: She is not in acute distress.    Appearance: She is well-developed.  HENT:     Head:  Normocephalic and atraumatic.     Right Ear: Tympanic membrane, ear canal and external ear normal.     Left Ear: External ear normal. Tympanic membrane is not erythematous.     Ears:     Comments: Left cerumen excess, TM seen partially.    Mouth/Throat:     Mouth: Oropharynx is clear and moist and mucous membranes are normal. Mucous membranes are moist.     Pharynx: Oropharynx is clear. Uvula midline.  Eyes:     Extraocular Movements: Extraocular movements intact and EOM  normal.     Conjunctiva/sclera: Conjunctivae normal.     Pupils: Pupils are equal, round, and reactive to light.  Neck:     Thyroid: No thyromegaly.     Trachea: No tracheal deviation.  Cardiovascular:     Rate and Rhythm: Normal rate and regular rhythm.     Pulses:          Dorsalis pedis pulses are 2+ on the right side and 2+ on the left side.     Heart sounds: No murmur heard.   Pulmonary:     Effort: Pulmonary effort is normal. No respiratory distress.     Breath sounds: Normal breath sounds.  Chest:  Breasts:     Right: No supraclavicular adenopathy.     Left: No supraclavicular adenopathy.    Abdominal:     Palpations: Abdomen is soft. There is no hepatomegaly or mass.     Tenderness: There is no abdominal tenderness.     Hernia: A hernia is present. Hernia is present in the umbilical area and ventral area.  Genitourinary:    Comments: Deferred to gyn. Musculoskeletal:        General: No edema.     Comments: No major deformity or signs of synovitis appreciated.  Lymphadenopathy:     Cervical: No cervical adenopathy.     Upper Body:     Right upper body: No supraclavicular adenopathy.     Left upper body: No supraclavicular adenopathy.  Skin:    General: Skin is warm.     Findings: No erythema or rash.  Neurological:     General: No focal deficit present.     Mental Status: She is alert and oriented to person, place, and time.     Cranial Nerves: No cranial nerve deficit.     Coordination: Coordination normal.     Gait: Gait normal.     Deep Tendon Reflexes: Strength normal.     Reflex Scores:      Bicep reflexes are 2+ on the right side and 2+ on the left side.      Patellar reflexes are 2+ on the right side and 2+ on the left side. Psychiatric:        Mood and Affect: Mood and affect normal.     Comments: Well groomed, good eye contact.   ASSESSMENT AND PLAN:  Amanda Jarvis was seen today for establish care and annual exam.  Diagnoses and all orders for  this visit: Orders Placed This Encounter  Procedures  . Comprehensive metabolic panel  . Hemoglobin A1c  . Lipid panel  . CBC  . Hepatitis C antibody screen  . Ambulatory referral to General Surgery   Lab Results  Component Value Date   CHOL 144 11/02/2020   HDL 48.70 11/02/2020   LDLCALC 65 11/02/2020   TRIG 154.0 (H) 11/02/2020   CHOLHDL 3 11/02/2020   Lab Results  Component Value Date   WBC 6.4 11/02/2020  HGB 14.5 11/02/2020   HCT 42.2 11/02/2020   MCV 91.7 11/02/2020   PLT 207.0 11/02/2020   Lab Results  Component Value Date   CREATININE 0.66 11/02/2020   BUN 12 11/02/2020   NA 140 11/02/2020   K 4.1 11/02/2020   CL 104 11/02/2020   CO2 30 11/02/2020   Lab Results  Component Value Date   ALT 35 11/02/2020   AST 23 11/02/2020   ALKPHOS 54 11/02/2020   BILITOT 1.2 11/02/2020   Lab Results  Component Value Date   HGBA1C 5.3 11/02/2020    Routine general medical examination at a health care facility She understands the importance of regular physical activity and healthy diet for prevention of chronic illness and/or complications. Preventive guidelines reviewed. Vaccination up to date, declines flu vaccine. Female preventive care to continue with gyn. Next CPE in a year.  Encounter for HCV screening test for low risk patient -     Hepatitis C antibody screen  Ventral hernia without obstruction or gangrene Asymptomatic. Instructed about warning signs. Appt with surgeon will be arranged.  DUB (dysfunctional uterine bleeding) Follows with gyn. Further recommendations according to lab results.  Screening for lipoid disorders -     Lipid panel  Screening for endocrine, metabolic and immunity disorder -     Comprehensive metabolic panel -     Hemoglobin A1c  Excessive cerumen in ear canal, unspecified laterality Avoid q tips. Debrox a few times per week in affected ear may help.  -     carbamide peroxide (DEBROX) 6.5 % OTIC solution; Place 5  drops into the left ear 2 (two) times daily.  Hyperhidrosis Associated with menses. Hx does not suggest a serious process. Further recommendations according to lab results.  Return in 1 year (on 11/02/2021).  Kiyani Jernigan G. [place], MD  [facility]. [place] office.   A few things to remember from today's visit:   Routine general medical examination at a health care facility  Encounter for HCV screening test for low risk patient - Plan: Hepatitis C antibody screen  Ventral hernia without obstruction or gangrene - Plan: Ambulatory referral to General Surgery  DUB (dysfunctional uterine bleeding) - Plan: Comprehensive metabolic panel, CBC  Screening for lipoid disorders - Plan: Lipid panel  Screening for endocrine, metabolic and immunity disorder - Plan: Comprehensive metabolic panel, Hemoglobin A1c  If you need refills please call your pharmacy. Do not use My Chart to request refills or for acute issues that need immediate attention.   Today you have you routine preventive visit.  At least 150 minutes of moderate exercise per week, daily brisk walking for 15-30 min is a good exercise option. Healthy diet low in saturated (animal) fats and sweets and consisting of fresh fruits and vegetables, lean meats such as fish and white chicken and whole grains.  These are some of recommendations for screening depending of age and risk factors:  - Vaccines:  Tdap vaccine every 10 years.  Shingles vaccine recommended at age 66, could be given after 34 years of age but not sure about insurance coverage.   Pneumonia vaccines: Pneumovax at 65. Sometimes Pneumovax is giving earlier if history of smoking, lung disease,diabetes,kidney disease among some.  Screening for diabetes at age 41 and every 3 years.  Cervical cancer prevention:  Pap smear starts at 35 years of age and continues periodically until 34 years old in low risk women. Pap smear every 3 years between 58 and 60 years  old. Pap smear  every 3-5 years between women 30 and older if pap smear negative and HPV screening negative.   -Breast cancer: Mammogram: There is disagreement between experts about when to start screening in low risk asymptomatic female but recent recommendations are to start screening at 66 and not later than 34 years old , every 1-2 years and after 34 yo q 2 years. Screening is recommended until 34 years old but some women can continue screening depending of healthy issues.  Colon cancer screening: Has been recently changed to 34 yo. Insurance may not cover until you are 34 years old. Screening is recommended until 34 years old.  Cholesterol disorder screening at age 44 and every 3 years.  Also recommended:  1. Dental visit- Brush and floss your teeth twice daily; visit your dentist twice a year. 2. Eye doctor- Get an eye exam at least every 2 years. 3. Helmet use- Always wear a helmet when riding a bicycle, motorcycle, rollerblading or skateboarding. 4. Safe sex- If you may be exposed to sexually transmitted infections, use a condom. 5. Seat belts- Seat belts can save your live; always wear one. 6. Smoke/Carbon Monoxide detectors- These detectors need to be installed on the appropriate level of your home. Replace batteries at least once a year. 7. Skin cancer- When out in the sun please cover up and use sunscreen 15 SPF or higher. 8. Violence- If anyone is threatening or hurting you, please tell your healthcare provider.  9. Drink alcohol in moderation- Limit alcohol intake to one drink or less per day. Never drink and drive. 10. Calcium supplementation 1000 to 1200 mg daily, ideally through your diet.  Vitamin D supplementation 800 units daily.  Please be sure medication list is accurate. If a new problem present, please set up appointment sooner than planned today.

## 2020-11-02 NOTE — Patient Instructions (Addendum)
A few things to remember from today's visit:   Routine general medical examination at a health care facility  Encounter for HCV screening test for low risk patient - Plan: Hepatitis C antibody screen  Ventral hernia without obstruction or gangrene - Plan: Ambulatory referral to General Surgery  DUB (dysfunctional uterine bleeding) - Plan: Comprehensive metabolic panel, CBC  Screening for lipoid disorders - Plan: Lipid panel  Screening for endocrine, metabolic and immunity disorder - Plan: Comprehensive metabolic panel, Hemoglobin A1c  If you need refills please call your pharmacy. Do not use My Chart to request refills or for acute issues that need immediate attention.   Today you have you routine preventive visit.  At least 150 minutes of moderate exercise per week, daily brisk walking for 15-30 min is a good exercise option. Healthy diet low in saturated (animal) fats and sweets and consisting of fresh fruits and vegetables, lean meats such as fish and white chicken and whole grains.  These are some of recommendations for screening depending of age and risk factors:  - Vaccines:  Tdap vaccine every 10 years.  Shingles vaccine recommended at age 69, could be given after 34 years of age but not sure about insurance coverage.   Pneumonia vaccines: Pneumovax at 65. Sometimes Pneumovax is giving earlier if history of smoking, lung disease,diabetes,kidney disease among some.  Screening for diabetes at age 2 and every 3 years.  Cervical cancer prevention:  Pap smear starts at 34 years of age and continues periodically until 34 years old in low risk women. Pap smear every 3 years between 89 and 27 years old. Pap smear every 3-5 years between women 30 and older if pap smear negative and HPV screening negative.   -Breast cancer: Mammogram: There is disagreement between experts about when to start screening in low risk asymptomatic female but recent recommendations are to start  screening at 105 and not later than 34 years old , every 1-2 years and after 34 yo q 2 years. Screening is recommended until 34 years old but some women can continue screening depending of healthy issues.  Colon cancer screening: Has been recently changed to 34 yo. Insurance may not cover until you are 34 years old. Screening is recommended until 34 years old.  Cholesterol disorder screening at age 83 and every 3 years.  Also recommended:  1. Dental visit- Brush and floss your teeth twice daily; visit your dentist twice a year. 2. Eye doctor- Get an eye exam at least every 2 years. 3. Helmet use- Always wear a helmet when riding a bicycle, motorcycle, rollerblading or skateboarding. 4. Safe sex- If you may be exposed to sexually transmitted infections, use a condom. 5. Seat belts- Seat belts can save your live; always wear one. 6. Smoke/Carbon Monoxide detectors- These detectors need to be installed on the appropriate level of your home. Replace batteries at least once a year. 7. Skin cancer- When out in the sun please cover up and use sunscreen 15 SPF or higher. 8. Violence- If anyone is threatening or hurting you, please tell your healthcare provider.  9. Drink alcohol in moderation- Limit alcohol intake to one drink or less per day. Never drink and drive. 10. Calcium supplementation 1000 to 1200 mg daily, ideally through your diet.  Vitamin D supplementation 800 units daily.  Please be sure medication list is accurate. If a new problem present, please set up appointment sooner than planned today.

## 2020-11-05 LAB — HEPATITIS C ANTIBODY
Hepatitis C Ab: NONREACTIVE
SIGNAL TO CUT-OFF: 0.01 (ref <1.00)

## 2020-11-07 ENCOUNTER — Ambulatory Visit: Payer: Self-pay | Admitting: Physician Assistant

## 2020-11-12 ENCOUNTER — Other Ambulatory Visit: Payer: Self-pay

## 2020-11-12 ENCOUNTER — Ambulatory Visit (INDEPENDENT_AMBULATORY_CARE_PROVIDER_SITE_OTHER): Payer: BC Managed Care – PPO | Admitting: Dermatology

## 2020-11-12 ENCOUNTER — Encounter: Payer: Self-pay | Admitting: Dermatology

## 2020-11-12 ENCOUNTER — Ambulatory Visit: Payer: Self-pay | Admitting: Dermatology

## 2020-11-12 DIAGNOSIS — Z1283 Encounter for screening for malignant neoplasm of skin: Secondary | ICD-10-CM

## 2020-11-12 DIAGNOSIS — I781 Nevus, non-neoplastic: Secondary | ICD-10-CM | POA: Diagnosis not present

## 2020-11-12 MED ORDER — RHOFADE 1 % EX CREA: 1.0000 "application " | TOPICAL_CREAM | Freq: Every day | CUTANEOUS | 6 refills | Status: DC

## 2020-11-12 NOTE — Patient Instructions (Signed)
Dr. Danae Orleans III, MD  [place]at[place], [place] COVID-19 info: [email or web address] Get online care: [email or web address] Address: [address] [place], [place] [address] Hours:  Open now    Add full hours    Phone: [phone]

## 2020-11-24 ENCOUNTER — Encounter: Payer: Self-pay | Admitting: Dermatology

## 2020-11-24 NOTE — Progress Notes (Addendum)
   New Patient   Subjective  Amanda Jarvis is a 35 y.o. female who presents for the following: Skin Problem (Rash like /pathy rings on chest x 7 years- no itch no bleed tx- none - get worse when I drink wine).  Red spot chest Location:  Duration: Years Quality: Stable Associated Signs/Symptoms: None Modifying Factors:  Severity:  Timing: Context:    The following portions of the chart were reviewed this encounter and updated as appropriate:  Tobacco  Allergies  Meds  Problems  Med Hx  Surg Hx  Fam Hx      Objective  Well appearing patient in no apparent distress; mood and affect are within normal limits. Objective  Right Upper Back: Waist up skin examination-no atypical moles or non mole skin cancer  Objective  Right Breast: 0.2 cm matted telangiectasia with no surface change; dermoscopy confirms.  Images      A focused examination was performed including Back, upper chest, head and neck.. Relevant physical exam findings are noted in the Assessment and Plan.   Assessment & Plan  Encounter for screening for malignant neoplasm of skin Right Upper Back  Telangiectasia Right Breast  Provided with Dr. Celene Kras Burton's name if she wants to pursue this by vascular laser [facility] division of dermatology).  Oxymetazoline HCl (RHOFADE) 1 % CREA - Right Breast

## 2020-12-11 ENCOUNTER — Telehealth: Payer: BC Managed Care – PPO | Admitting: Emergency Medicine

## 2020-12-11 DIAGNOSIS — H6091 Unspecified otitis externa, right ear: Secondary | ICD-10-CM | POA: Diagnosis not present

## 2020-12-11 MED ORDER — CIPROFLOXACIN-DEXAMETHASONE 0.3-0.1 % OT SUSP: 4.0000 [drp] | Freq: Two times a day (BID) | OTIC | 0 refills | Status: AC

## 2020-12-11 NOTE — Progress Notes (Signed)
E Visit for Swimmer's Ear  We are sorry that you are not feeling well. Here is how we plan to help!  I've sent an Rx for Ciprodex drops to your pharmacy.  Take as directed.  In certain cases swimmer's ear may progress to a more serious bacterial infection of the middle or inner ear.  If you have a fever 102 and up and significantly worsening symptoms, this could indicate a more serious infection moving to the middle/inner and needs face to face evaluation in an office by a provider.  Your symptoms should improve over the next 3 days and should resolve in about 7 days.  HOME CARE:   Wash your hands frequently.  Do not place the tip of the bottle on your ear or touch it with your fingers.  You can take Acetominophen 650 mg every 4-6 hours as needed for pain.  If pain is severe or moderate, you can apply a heating pad (set on low) or hot water bottle (wrapped in a towel) to outer ear for 20 minutes.  This will also increase drainage.  Avoid ear plugs  Do not use Q-tips  After showers, help the water run out by tilting your head to one side.  GET HELP RIGHT AWAY IF:   Fever is over 102.2 degrees.  You develop progressive ear pain or hearing loss.  Ear symptoms persist longer than 3 days after treatment.  MAKE SURE YOU:   Understand these instructions.  Will watch your condition.  Will get help right away if you are not doing well or get worse.  TO PREVENT SWIMMER'S EAR:  Use a bathing cap or custom fitted swim molds to keep your ears dry.  Towel off after swimming to dry your ears.  Tilt your head or pull your earlobes to allow the water to escape your ear canal.  If there is still water in your ears, consider using a hairdryer on the lowest setting.  Thank you for choosing an e-visit. Your e-visit answers were reviewed by a board certified advanced clinical practitioner to complete your personal care plan. Depending upon the condition, your plan could have included  both over the counter or prescription medications. Please review your pharmacy choice. Be sure that the pharmacy you have chosen is open so that you can pick up your prescription now.  If there is a problem you may message your provider in MyChart to have the prescription routed to another pharmacy. Your safety is important to [place]. If you have drug allergies check your prescription carefully.  For the next 24 hours, you can use MyChart to ask questions about today's visit, request a non-urgent call back, or ask for a work or school excuse from your e-visit provider. You will get an email in the next two days asking about your experience. I hope that your e-visit has been valuable and will speed your recovery.      Approximately 5 minutes was used in reviewing the patient's chart, questionnaire, prescribing medications, and documentation.

## 2020-12-17 ENCOUNTER — Encounter: Payer: Self-pay | Admitting: Family Medicine

## 2020-12-18 ENCOUNTER — Other Ambulatory Visit: Payer: Self-pay

## 2020-12-18 ENCOUNTER — Encounter: Payer: Self-pay | Admitting: Family Medicine

## 2020-12-18 ENCOUNTER — Ambulatory Visit: Payer: BC Managed Care – PPO | Admitting: Family Medicine

## 2020-12-18 VITALS — BP 118/70 | HR 77 | Temp 98.0°F | Ht 65.5 in | Wt 159.2 lb

## 2020-12-18 DIAGNOSIS — H6123 Impacted cerumen, bilateral: Secondary | ICD-10-CM | POA: Diagnosis not present

## 2020-12-18 NOTE — Progress Notes (Signed)
Established Patient Office Visit  Subjective:  Patient ID: Amanda Jarvis, female    DOB: Jul 19, 1986  Age: 35 y.o. MRN: [id number]  CC:  Chief Complaint  Patient presents with  . Otitis Externa    HPI Amanda Jarvis presents for fullness right ear and decreased hearing in the right ear for several weeks.  She was down in [place] and went swimming on 26 February.  By the 28th she noticed some decreased hearing.  She has never had any ear pain or ear drainage.  No dizziness.  She had virtual visit on the eighth and was diagnosed with "otitis externa".  She again has never had any pain.  She was prescribed Ciprodex drops which has not made any difference at all.  She had history of occipital up in the past.  Past Medical History:  Diagnosis Date  . Anemia    with 1st pregnancy  . Medical history non-contributory   . Sickle cell trait [facility])     Past Surgical History:  Procedure Laterality Date  . NO PAST SURGERIES    . TUBAL LIGATION Bilateral 07/24/2013   Procedure: POST PARTUM TUBAL LIGATION;  Surgeon: Turner Daniels, MD;  Location: [facility];  Service: Gynecology;  Laterality: Bilateral;    Family History  Problem Relation Age of Onset  . Hypertension Mother   . Hyperlipidemia Maternal Grandmother   . Stroke Maternal Grandmother   . Stroke Paternal Grandfather   . Heart disease Paternal Grandfather     Social History   Socioeconomic History  . Marital status: Single    Spouse name: Not on file  . Number of children: Not on file  . Years of education: Not on file  . Highest education level: Not on file  Occupational History  . Not on file  Tobacco Use  . Smoking status: Current Some Day Smoker  . Smokeless tobacco: Never Used  Substance and Sexual Activity  . Alcohol use: Yes    Alcohol/week: 4.0 standard drinks    Types: 4 Glasses of wine per week  . Drug use: No  . Sexual activity: Yes    Birth control/protection: None  Other Topics  Concern  . Not on file  Social History Narrative  . Not on file   Social Determinants of Health   Financial Resource Strain: Not on file  Food Insecurity: Not on file  Transportation Needs: Not on file  Physical Activity: Not on file  Stress: Not on file  Social Connections: Not on file  Intimate Partner Violence: Not on file    Outpatient Medications Prior to Visit  Medication Sig Dispense Refill  . carbamide peroxide (DEBROX) 6.5 % OTIC solution Place 5 drops into the left ear 2 (two) times daily. (Patient not taking: Reported on 11/12/2020) 15 mL 0  . Oxymetazoline HCl (RHOFADE) 1 % CREA Apply 1 application topically daily. 30 g 6   No facility-administered medications prior to visit.    No Known Allergies  ROS Review of Systems  Constitutional: Negative for chills and fever.  HENT: Positive for hearing loss. Negative for ear discharge.   Neurological: Negative for dizziness and headaches.      Objective:    Physical Exam Vitals reviewed.  Constitutional:      Appearance: Normal appearance.  HENT:     Ears:     Comments: She has cerumen impactions bilaterally.  No evidence for otitis externa Neurological:     Mental Status: She is alert.  BP 118/70   Pulse 77   Temp 98 F (36.7 C) (Oral)   Ht 5' 5.5" (1.664 m)   Wt 159 lb 4 oz (72.2 kg)   SpO2 99%   BMI 26.10 kg/m  Wt Readings from Last 3 Encounters:  12/18/20 159 lb 4 oz (72.2 kg)  11/02/20 155 lb (70.3 kg)  04/29/17 153 lb (69.4 kg)     Health Maintenance Due  Topic Date Due  . COVID-19 Vaccine (1) Never done  . INFLUENZA VACCINE  Never done    There are no preventive care reminders to display for this patient.  Lab Results  Component Value Date   TSH 1.300 03/20/2017   Lab Results  Component Value Date   WBC 6.4 11/02/2020   HGB 14.5 11/02/2020   HCT 42.2 11/02/2020   MCV 91.7 11/02/2020   PLT 207.0 11/02/2020   Lab Results  Component Value Date   NA 140 11/02/2020   K 4.1  11/02/2020   CO2 30 11/02/2020   GLUCOSE 87 11/02/2020   BUN 12 11/02/2020   CREATININE 0.66 11/02/2020   BILITOT 1.2 11/02/2020   ALKPHOS 54 11/02/2020   AST 23 11/02/2020   ALT 35 11/02/2020   PROT 6.8 11/02/2020   ALBUMIN 4.5 11/02/2020   CALCIUM 9.6 11/02/2020   GFR 114.85 11/02/2020   Lab Results  Component Value Date   CHOL 144 11/02/2020   Lab Results  Component Value Date   HDL 48.70 11/02/2020   Lab Results  Component Value Date   LDLCALC 65 11/02/2020   Lab Results  Component Value Date   TRIG 154.0 (H) 11/02/2020   Lab Results  Component Value Date   CHOLHDL 3 11/02/2020   Lab Results  Component Value Date   HGBA1C 5.3 11/02/2020      Assessment & Plan:   Bilateral cerumen impactions  -Discussed risk and benefits of irrigation including risk of pain, low risk of bleeding, low risk of eardrum perforation.  Patient consents -Right ear canal irrigated and most of the cerumen was removed.  She had some very small residual mallet up against the eardrum.  She did have some retained cerumen in the left canal -We recommend she try some over-the-counter Debrox or Cerumenex at least once daily leave in for 30 minutes and then irrigate gently with lukewarm water with rubber bulb syringe.  Be in touch for follow-up if cerumen not fully clearing over the next week or so  No orders of the defined types were placed in this encounter.   Follow-up: No follow-ups on file.    Evelena Peat, MD

## 2020-12-18 NOTE — Patient Instructions (Signed)
Earwax Buildup, Adult The ears produce a substance called earwax that helps keep bacteria out of the ear and protects the skin in the ear canal. Occasionally, earwax can build up in the ear and cause discomfort or hearing loss. What are the causes? This condition is caused by a buildup of earwax. Ear canals are self-cleaning. Ear wax is made in the outer part of the ear canal and generally falls out in small amounts over time. When the self-cleaning mechanism is not working, earwax builds up and can cause decreased hearing and discomfort. Attempting to clean ears with cotton swabs can push the earwax deep into the ear canal and cause decreased hearing and pain. What increases the risk? This condition is more likely to develop in people who:  Clean their ears often with cotton swabs.  Pick at their ears.  Use earplugs or in-ear headphones often, or wear hearing aids. The following factors may also make you more likely to develop this condition:  Being female.  Being of older age.  Naturally producing more earwax.  Having narrow ear canals.  Having earwax that is overly thick or sticky.  Having excess hair in the ear canal.  Having eczema.  Being dehydrated. What are the signs or symptoms? Symptoms of this condition include:  Reduced or muffled hearing.  A feeling of fullness in the ear or feeling that the ear is plugged.  Fluid coming from the ear.  Ear pain or an itchy ear.  Ringing in the ear.  Coughing.  Balance problems.  An obvious piece of earwax that can be seen inside the ear canal. How is this diagnosed? This condition may be diagnosed based on:  Your symptoms.  Your medical history.  An ear exam. During the exam, your health care provider will look into your ear with an instrument called an otoscope. You may have tests, including a hearing test. How is this treated? This condition may be treated by:  Using ear drops to soften the earwax.  Having  the earwax removed by a health care provider. The health care provider may: ? Flush the ear with water. ? Use an instrument that has a loop on the end (curette). ? Use a suction device.  Having surgery to remove the wax buildup. This may be done in severe cases. Follow these instructions at home:  Take over-the-counter and prescription medicines only as told by your health care provider.  Do not put any objects, including cotton swabs, into your ear. You can clean the opening of your ear canal with a washcloth or facial tissue.  Follow instructions from your health care provider about cleaning your ears. Do not overclean your ears.  Drink enough fluid to keep your urine pale yellow. This will help to thin the earwax.  Keep all follow-up visits as told. If earwax builds up in your ears often or if you use hearing aids, consider seeing your health care provider for routine, preventive ear cleanings. Ask your health care provider how often you should schedule your cleanings.  If you have hearing aids, clean them according to instructions from the manufacturer and your health care provider.   Contact a health care provider if:  You have ear pain.  You develop a fever.  You have pus or other fluid coming from your ear.  You have hearing loss.  You have ringing in your ears that does not go away.  You feel like the room is spinning (vertigo).  Your symptoms do not   improve with treatment. Get help right away if:  You have bleeding from the affected ear.  You have severe ear pain. Summary  Earwax can build up in the ear and cause discomfort or hearing loss.  The most common symptoms of this condition include reduced or muffled hearing, a feeling of fullness in the ear, or feeling that the ear is plugged.  This condition may be diagnosed based on your symptoms, your medical history, and an ear exam.  This condition may be treated by using ear drops to soften the earwax or by  having the earwax removed by a health care provider.  Do not put any objects, including cotton swabs, into your ear. You can clean the opening of your ear canal with a washcloth or facial tissue. This information is not intended to replace advice given to you by your health care provider. Make sure you discuss any questions you have with your health care provider. Document Revised: 01/10/2020 Document Reviewed: 01/10/2020 Elsevier Patient Education  2021 [employer]  Consider Cerumenex or Debrox wax softener drops and leave in for about 30 minutes followed by irrigation with lukewarm water with rubber bulb syringe.

## 2021-01-09 ENCOUNTER — Ambulatory Visit: Payer: Self-pay | Admitting: General Surgery

## 2021-01-09 DIAGNOSIS — K429 Umbilical hernia without obstruction or gangrene: Secondary | ICD-10-CM | POA: Diagnosis not present

## 2021-01-09 DIAGNOSIS — K439 Ventral hernia without obstruction or gangrene: Secondary | ICD-10-CM | POA: Diagnosis not present

## 2021-01-09 NOTE — H&P (Signed)
Amanda Jarvis Appointment: 01/09/2021 9:45 AM Location: [facility] Patient #: [id number] DOB: Oct 16, 1985 Single / Language: [place] / Race: Refused to Report/Unreported Female  History of Present Illness Amanda Areola [name] MD; 01/09/2021 10:04 AM) The patient is a 35 year old female who presents with an abdominal wall hernia. She comes in for long-term follow-up. I initially saw her in 2018 for an umbilical hernia. We got a CT scan that showed an umbilical hernia as well as a probable epigastric hernia containing fat. She was scheduled for surgery but canceled. She comes back in because she is ready to schedule surgery. She denies any medical changes since I last saw her in 2018. She is having some occasional discomfort when it sticks out. It is generally reducible. No abdominal surgeries since I last saw her. No trips the emergency room or hospital. She smokes. A pack last 3 weeks. No chest pain, chest pressure, source of breath. No nausea or vomiting.  2018 She is referred by Amanda Platt PA-C for question of periumbilical discomfort. She feels like she may have a hernia above her bellybutton. She states that if she eats a fair amount she will have pressure and increased tenderness in that location. It especially bothers her with physical activity and she has had to cut back on the amount that she does at the gym. She has had this bulge for about 7 years. She developed it after her second pregnancy. She has had a tubal ligation. She denies any fever, chills, vomiting, right upper quadrant pain. She denies any diarrhea or constipation. She smokes cigarettes occasionally but a pack may last her 3 weeks   Problem List/Past Medical Amanda Areola M. Andrey Campanile, MD; 01/09/2021 10:07 AM) UMBILICAL HERNIA WITHOUT OBSTRUCTION OR GANGRENE (K42.9) VENTRAL HERNIA WITHOUT OBSTRUCTION OR GANGRENE (K43.9)  Past Surgical History Amanda Coots, CNA; 01/09/2021 9:42 AM) No pertinent past  surgical history  Diagnostic Studies History Amanda Coots, CNA; 01/09/2021 9:42 AM) Colonoscopy never Mammogram never Pap Smear 1-5 years ago  Allergies Amanda Coots, CNA; 01/09/2021 9:43 AM) No Known Drug Allergies [04/23/2017]: Allergies Reconciled  Medication History Amanda Coots, CNA; 01/09/2021 9:43 AM) No Current Medications Medications Reconciled  Social History Amanda Areola M. Andrey Campanile, MD; 01/09/2021 10:07 AM) Alcohol use Occasional alcohol use. Caffeine use Coffee. No drug use Tobacco use Current some day smoker. Illicit drug use Prefer to discuss with provider.  Family History Amanda Coots, CNA; 01/09/2021 9:42 AM) First Degree Relatives No pertinent family history  Pregnancy / Birth History Amanda Coots, CNA; 01/09/2021 9:42 AM) Age at menarche 10 years. Gravida 3 Length (months) of breastfeeding 3-6 7-12 Maternal age 65-20 Para 3 Regular periods  Other Problems Amanda Areola M. Andrey Campanile, MD; 01/09/2021 10:07 AM) Back Pain Ventral Hernia Repair     Review of Systems Amanda Areola M. Salmaan Patchin MD; 01/09/2021 10:04 AM) General Not Present- Appetite Loss, Chills, Fatigue, Fever, Night Sweats, Weight Gain and Weight Loss. Skin Present- Hives. Not Present- Change in Wart/Mole, Dryness, Jaundice, New Lesions, Non-Healing Wounds, Rash and Ulcer. HEENT Not Present- Earache, Hearing Loss, Hoarseness, Nose Bleed, Oral Ulcers, Ringing in the Ears, Seasonal Allergies, Sinus Pain, Sore Throat, Visual Disturbances, Wears glasses/contact lenses and Yellow Eyes. Respiratory Not Present- Bloody sputum, Chronic Cough, Difficulty Breathing, Snoring and Wheezing. Cardiovascular Not Present- Chest Pain, Difficulty Breathing Lying Down, Leg Cramps, Palpitations, Rapid Heart Rate, Shortness of Breath and Swelling of Extremities. Gastrointestinal Not Present- Abdominal Pain, Bloating, Bloody Stool, Change in Bowel Habits, Chronic diarrhea, Constipation, Difficulty Swallowing,  Excessive gas, Gets full  quickly at meals, Hemorrhoids, Indigestion, Nausea, Rectal Pain and Vomiting. Female Genitourinary Not Present- Frequency, Nocturia, Painful Urination, Pelvic Pain and Urgency. Musculoskeletal Not Present- Back Pain, Joint Pain, Joint Stiffness, Muscle Pain, Muscle Weakness and Swelling of Extremities. Neurological Not Present- Decreased Memory, Fainting, Headaches, Numbness, Seizures, Tingling, Tremor, Trouble walking and Weakness. Psychiatric Not Present- Anxiety, Bipolar, Change in Sleep Pattern, Depression, Fearful and Frequent crying. Endocrine Not Present- Cold Intolerance, Excessive Hunger, Hair Changes, Heat Intolerance, Hot flashes and New Diabetes. Hematology Not Present- Blood Thinners, Easy Bruising, Excessive bleeding, Gland problems, HIV and Persistent Infections. All other systems negative  Vitals (Amanda Alston CNA; 01/09/2021 9:43 AM) 01/09/2021 9:43 AM Weight: 158.25 lb Height: 64in Body Surface Area: 1.77 m Body Mass Index: 27.16 kg/m  Temp.: 97.91F  Pulse: 84 (Regular)  P.OX: 98% (Room air) BP: 118/60(Sitting, Left Arm, Standard)        Physical Exam Amanda Areola M. Wacey Zieger MD; 01/09/2021 10:05 AM)  General Mental Status-Alert. General Appearance-Cooperative. Orientation-Oriented X4. Posture-Normal posture.  Integumentary Global Assessment Upon inspection and palpation of skin surfaces of the - Head/Face: no rashes, ulcers, lesions or evidence of photo damage. No palpable nodules or masses and Neck: no visible lesions or palpable masses.  Head and Neck Head-normocephalic, atraumatic with no lesions or palpable masses. Face Global Assessment - atraumatic. Thyroid Gland Characteristics - normal size and consistency.  Eye Eyeball - Bilateral-Extraocular movements intact. Sclera/Conjunctiva - Bilateral-No scleral icterus, No Discharge - Bilateral.  ENMT Nose and Sinuses External Inspection of the Nose - no  deformities observed, no swelling present.  Chest and Lung Exam Palpation Palpation of the chest reveals - Non-tender. Auscultation Breath sounds - Normal.  Cardiovascular Auscultation Rhythm - Regular. Heart Sounds - S1 WNL and S2 WNL. Carotid arteries - No Carotid bruit.  Abdomen Inspection Inspection of the abdomen reveals - No Visible peristalsis, No Abnormal pulsations and No Paradoxical movements. Palpation/Percussion Palpation and Percussion of the abdomen reveal - Soft, Non Tender, No Rebound tenderness, No Rigidity (guarding), No hepatosplenomegaly and No Palpable abdominal masses. Note: small bulge at 7 o'clock in umbilicus- hernia. pt examined supine and standing with/without valsalva. chaperone present. just above umbilicus i feel a fascial defect which is more vertical than wide, soft, mild TTP, prob about 3 cm vertical by 1.5-2cm wide  Peripheral Vascular Upper Extremity Palpation - Pulses bilaterally normal. Lower Extremity Palpation - Edema - Bilateral - No edema - Bilateral.  Neurologic Neurologic evaluation reveals -normal sensation and normal coordination.  Neuropsychiatric Mental status exam performed with findings of-able to articulate well with normal speech/language, rate, volume and coherence and thought content normal with ability to perform basic computations and apply abstract reasoning.  Musculoskeletal Normal Exam - Bilateral-Upper Extremity Strength Normal and Lower Extremity Strength Normal.    Assessment & Plan Amanda Areola M. Kimarion Chery MD; 01/09/2021 10:07 AM)  VENTRAL HERNIA WITHOUT OBSTRUCTION OR GANGRENE (K43.9) Impression: We rediscussed the etiology of ventral hernias. We discussed the signs and symptoms of incarceration and strangulation. The patient was given educational material. I also drew diagrams. reviewed her ct which showed a 2cm wide supraumbilical hernia and 2.5 hernia/diastasis at the umbilicus. she has intact fascia between the 2  hernias but total vertical dimension is 6 cm  We discussed nonoperative and operative management. With respect to operative management, we rediscussed a laparoscopic assisted approach whereby the mesh would be placed laparoscopic intraperitoneal but a small incision made on the abdominal wall directly over the defect to reapproximate the abdominal wall muscles  The  patient is toward laparoscopic assisted ventral hernia repair with mesh.  We discussed the risk and benefits of surgery including but not limited to bleeding, infection, injury to surrounding structures, hernia recurrence, mesh complications, hematoma/seroma formation, need to convert to an open procedure, blood clot formation, urinary retention, post operative ileus, general anesthesia risk, long-term abdominal pain. We discussed that this procedure can be quite uncomfortable and difficult to recover from based on how the mesh is secured to the abdominal wall. We discussed the importance of avoiding heavy lifting and straining for a period of 6 weeks.  she would like to proceed with surgery  This patient encounter took 33 minutes today to perform the following: take history, perform exam, review outside records, interpret imaging, counsel the patient on their diagnosis and document encounter, findings & plan in the EHR  Current Plans Pt Education - Pamphlet Given - Hernia Surgery: discussed with patient and provided information.  UMBILICAL HERNIA WITHOUT OBSTRUCTION OR GANGRENE (K42.9)  Current Plans You are being scheduled for surgery- Our schedulers will call you.  You should hear from our office's scheduling department within 5 working days about the location, date, and time of surgery. We try to make accommodations for patient's preferences in scheduling surgery, but sometimes the OR schedule or the surgeon's schedule prevents [place] from making those accommodations.  If you have not heard from our office [phone]) in 5  working days, call the office and ask for your surgeon's nurse.  If you have other questions about your diagnosis, plan, or surgery, call the office and ask for your surgeon's nurse.  Mary Sella. Andrey Campanile, MD, FACS General, Bariatric, & Minimally Invasive Surgery [facility], [place]

## 2021-05-28 ENCOUNTER — Ambulatory Visit: Payer: BC Managed Care – PPO | Admitting: Family Medicine

## 2021-05-28 ENCOUNTER — Encounter: Payer: Self-pay | Admitting: Family Medicine

## 2021-05-28 ENCOUNTER — Other Ambulatory Visit: Payer: Self-pay

## 2021-05-28 VITALS — BP 120/70 | HR 72 | Temp 98.4°F | Resp 16 | Ht 65.5 in | Wt 152.1 lb

## 2021-05-28 DIAGNOSIS — M25512 Pain in left shoulder: Secondary | ICD-10-CM | POA: Diagnosis not present

## 2021-05-28 DIAGNOSIS — M7542 Impingement syndrome of left shoulder: Secondary | ICD-10-CM

## 2021-05-28 DIAGNOSIS — R41840 Attention and concentration deficit: Secondary | ICD-10-CM | POA: Diagnosis not present

## 2021-05-28 MED ORDER — CELECOXIB 100 MG PO CAPS: 100.0000 mg | ORAL_CAPSULE | Freq: Two times a day (BID) | ORAL | 0 refills | Status: AC | PRN

## 2021-05-28 MED ORDER — TRAMADOL HCL 50 MG PO TABS
50.0000 mg | ORAL_TABLET | Freq: Every evening | ORAL | 0 refills | Status: AC | PRN
Start: 2021-05-28 — End: 2021-06-07

## 2021-05-28 NOTE — Patient Instructions (Addendum)
A few things to remember from today's visit:  Impingement syndrome of left shoulder - Plan: celecoxib (CELEBREX) 100 MG capsule, Ambulatory referral to Physical Therapy, traMADol (ULTRAM) 50 MG tablet  Left shoulder pain, unspecified chronicity - Plan: celecoxib (CELEBREX) 100 MG capsule, Ambulatory referral to Physical Therapy, traMADol (ULTRAM) 50 MG tablet  If you need refills please call your pharmacy. Do not use My Chart to request refills or for acute issues that need immediate attention.   Tramadol at bedtime as needed. Celebrex for 7-10 days then as needed. PT will be arranged.  Please be sure medication list is accurate. If a new problem present, please set up appointment sooner than planned today.  For concentration concerns you can call and arrange appt for screening. [facility] in [place], [place] Address: [address] [place], [place], [place] [address] Phone: [phone]

## 2021-05-28 NOTE — Progress Notes (Signed)
Chief Complaint  Patient presents with   Shoulder Pain    Left shoulder pain x a month. Movement makes it worse, unable to have full range of motion. Waking up at night due to the pain, has tried OTC pain meds, NSAIDs, deep tissue massage with little to no relief. Has had some numbness and tingling down the left arm. Denies any chest pain.    HPI: Amanda Jarvis is a 35 y.o.right handed female, who is here today with above complaint. It has been going on for a month but 2 days ago she started with intermittent LUE tingling. No associated neck pain or weakness. She woke up with pain after sleeping on her left forearm throughout the night.  Shoulder Pain  The pain is present in the left shoulder and left arm. This is a new problem. The current episode started 1 to 4 weeks ago. There has been no history of extremity trauma. The problem occurs constantly. The problem has been gradually worsening. The quality of the pain is described as aching. The pain is at a severity of 8/10. Associated symptoms include a limited range of motion, stiffness and tingling. Pertinent negatives include no fever, inability to bear weight, itching, joint locking or joint swelling. The symptoms are aggravated by activity. She has tried acetaminophen, NSAIDS, rest and OTC pain meds (deep tissue massage) for the symptoms. The treatment provided mild relief. Family history does not include gout or rheumatoid arthritis. There is no history of diabetes, gout, osteoarthritis or rheumatoid arthritis.   She tried massage and helped temporarily. No limitation of ROM but it elicits pain. Tylenol and ibuprofen have not longer helping.  She is also concerned about problems with concentration. She is back to school to finish her bachelor after years of not being at school and has noted difficulty retaining new information. She has to read articles a couple times to be able to understand. No known hx of ADD/ADHD  during childhood and denies depression.  Review of Systems  Constitutional: Negative for activity change, appetite change and fatigue.  HENT: Negative for mouth sores and sore throat.   Respiratory: Negative for cough and wheezing.   Cardiovascular: Negative for chest pain and palpitations.  Gastrointestinal: Negative for abdominal pain, nausea and vomiting.       Negative for changes in bowel habits.  Musculoskeletal: Negative for gait problem. Skin: Negative for rash. Psychiatric/Behavioral: Negative for confusion. The patient is not nervous/anxious.   Rest see pertinent positives and negatives per HPI.  No current outpatient medications on file prior to visit.   No current facility-administered medications on file prior to visit.   Past Medical History:  Diagnosis Date   Anemia    with 1st pregnancy   Medical history non-contributory    Sickle cell trait (HCC)    No Known Allergies  Social History   Socioeconomic History   Marital status: Single    Spouse name: Not on file   Number of children: Not on file   Years of education: Not on file   Highest education level: Not on file  Occupational History   Not on file  Tobacco Use   Smoking status: Former    Types: Cigarettes    Quit date: 04/23/2021    Years since quitting: 0.0   Smokeless tobacco: Never  Substance and Sexual Activity   Alcohol use: Yes    Alcohol/week: 4.0 standard drinks    Types: 4 Glasses of wine per week  Drug use: No   Sexual activity: Yes    Birth control/protection: None  Other Topics Concern   Not on file  Social History Narrative   Not on file   Social Determinants of Health   Financial Resource Strain: Not on file  Food Insecurity: Not on file  Transportation Needs: Not on file  Physical Activity: Not on file  Stress: Not on file  Social Connections: Not on file   Vitals:   05/28/21 0856  BP: 120/70  Pulse: 72  Resp: 16  Temp: 98.4 F (36.9 C)  SpO2: 98%  Body mass  index is 24.93 kg/m.  Physical Exam  Nursing note and vitals reviewed. Constitutional: She is oriented to person, place, and time. She appears well-developed. No distress.  HENT:  Head: Normocephalic and atraumatic.  Eyes: Conjunctivae are normal.  Cardiovascular: Normal rate and regular rhythm.  No murmur heard. Pulses:      Radial and ulnar pulses are 2+ on the left UE. Respiratory: Effort normal and breath sounds normal. No respiratory distress.  Musculoskeletal:        General: No edema.  Left shoulder: No deformity, edema, or erythema appreciated.No muscle atrophy. Juanetta Gosling' test positive, drop arm rotator cuff test positive, empty can supraspinatus test positive, cross body adduction test positive, lift-Off Subscapularis test positive. ROM otherwise normal,elicits pain. Lymphadenopathy:    She has no cervical adenopathy.  Neurological: She is alert and oriented to person, place, and time. She has normal strength. No cranial nerve deficit. Gait normal.  Skin: Skin is warm. No rash noted. No erythema.  Psychiatric: She has a normal mood and affect.  Well groomed, good eye contact.   ASSESSMENT AND PLAN:  Ms.Tayli was seen today for shoulder pain. Diagnoses and all orders for this visit: Orders Placed This Encounter  Procedures   Ambulatory referral to Physical Therapy   Left shoulder pain, unspecified chronicity Differential Dx's discussed. Hx and clinical findings suggest rotator cuff tendinoplasty. Declined Toradol injection here in the office. No hx of trauma,so we can hold on imaging.  -     celecoxib (CELEBREX) 100 MG capsule; Take 1 capsule (100 mg total) by mouth 2 (two) times daily as needed. -     Ambulatory referral to Physical Therapy -     traMADol (ULTRAM) 50 MG tablet; Take 1 tablet (50 mg total) by mouth at bedtime as needed for up to 10 days.  Impingement syndrome of left shoulder We discussed Dx and treatment options. She agrees with no subacromial  steroid ingestion. ROM exercises while PT is being arranged. Celebrex 100 mg bid x 7-10 days then prn. Tramadol 50 mg at bedtime prn. Some side effects discussed. F/U as needed.   -     celecoxib (CELEBREX) 100 MG capsule; Take 1 capsule (100 mg total) by mouth 2 (two) times daily as needed. -     Ambulatory referral to Physical Therapy -     traMADol (ULTRAM) 50 MG tablet; Take 1 tablet (50 mg total) by mouth at bedtime as needed for up to 10 days.  Attention or concentration deficit We discussed possible causes. ? Adult ADD. Recommend arranging appt for screening at [facility],contact information on AVS.  Return if symptoms worsen or fail to improve.  Bryson Palen G. [place], MD  [facility]. [place] office.

## 2021-06-04 ENCOUNTER — Other Ambulatory Visit: Payer: Self-pay | Admitting: Family Medicine

## 2021-06-04 DIAGNOSIS — M25512 Pain in left shoulder: Secondary | ICD-10-CM

## 2021-06-04 DIAGNOSIS — M7542 Impingement syndrome of left shoulder: Secondary | ICD-10-CM

## 2021-06-13 ENCOUNTER — Other Ambulatory Visit: Payer: Self-pay | Admitting: Family Medicine

## 2021-06-13 NOTE — Telephone Encounter (Signed)
Patient called in to see if Dr.Jordan could send her in some more of traMADol HCl 50 because they only provided her with 5 when she was supposed to have 10.  Patient can be contacted at [phone] if anyone have any questions.  Pharmacy is [facility] [address] [place] - [address]  [address], [place] [address]     Please advise.

## 2021-06-14 NOTE — Telephone Encounter (Signed)
Reviewed PMP, pt did only receive 5 tablets.

## 2021-06-17 NOTE — Addendum Note (Signed)
Addended by: Kathreen Devoid on: 06/17/2021 03:11 PM   Modules accepted: Orders

## 2021-06-18 MED ORDER — TRAMADOL HCL 50 MG PO TABS: 50.0000 mg | ORAL_TABLET | Freq: Every day | ORAL | 0 refills | Status: AC | PRN

## 2021-06-20 ENCOUNTER — Ambulatory Visit: Payer: BC Managed Care – PPO | Attending: Family Medicine | Admitting: Physical Therapy

## 2022-01-29 IMAGING — US US PELVIS COMPLETE WITH TRANSVAGINAL
1 series · 14 of 25 positions shown · non-contrast
Comparison: None

CLINICAL DATA: Menorrhagia.



[Series 1: us pelvis complete with transvaginal · 0.23mm/px · 14 of 58 slices shown]
[im 1/58]
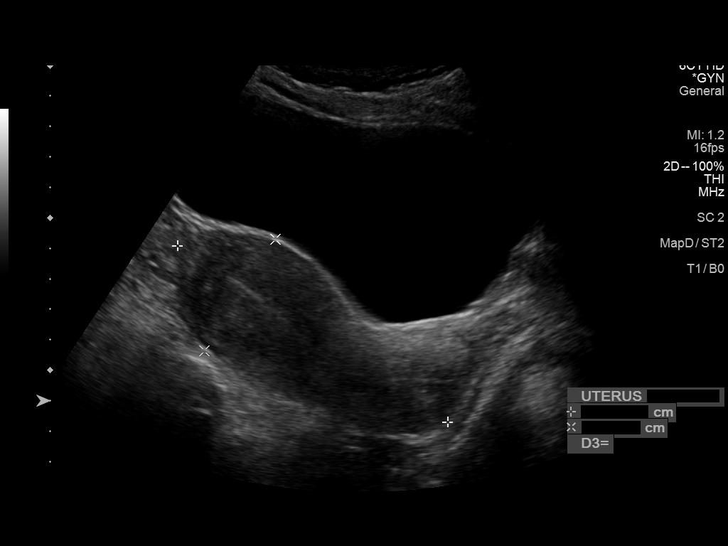
[im 5/58]
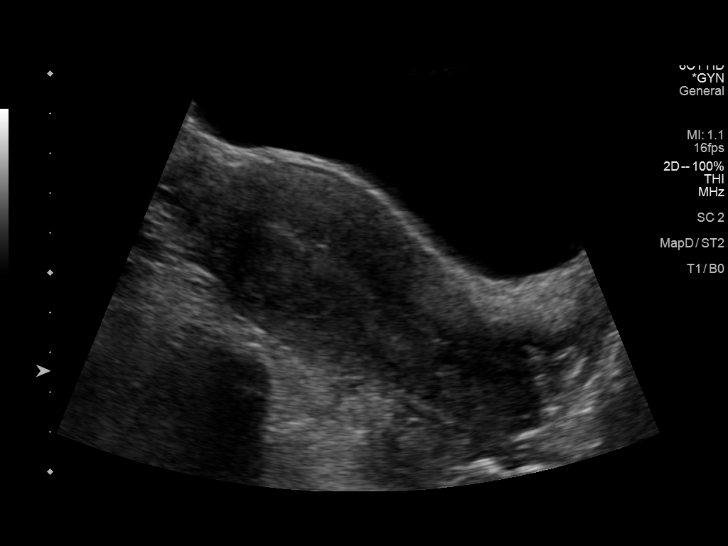
[im 10/58]
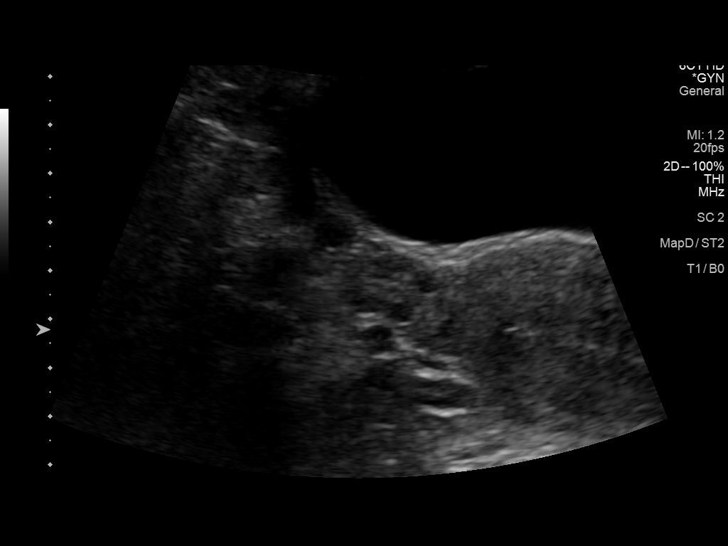
[im 15/58]
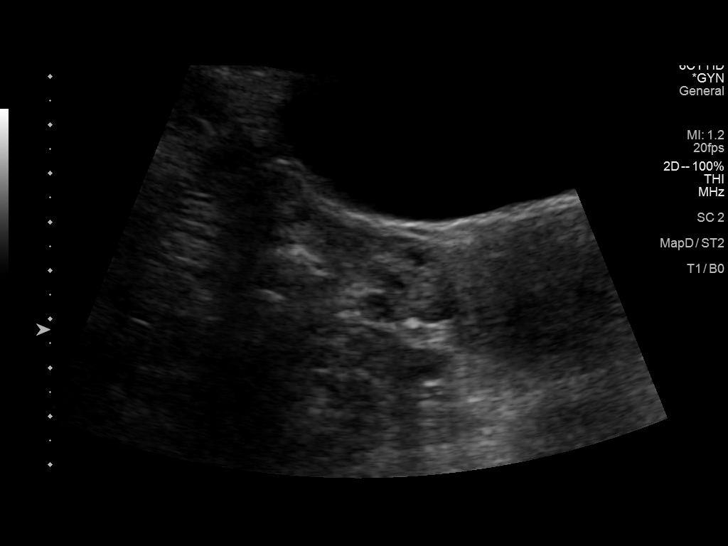
[im 20/58]
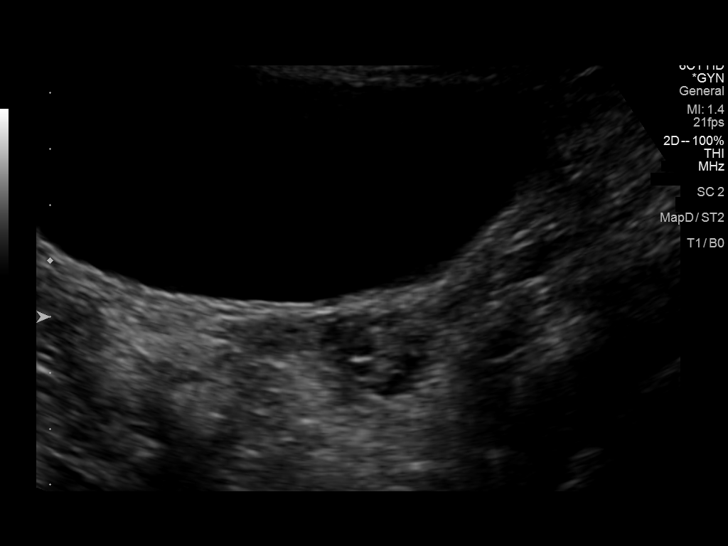
[im 22/58]
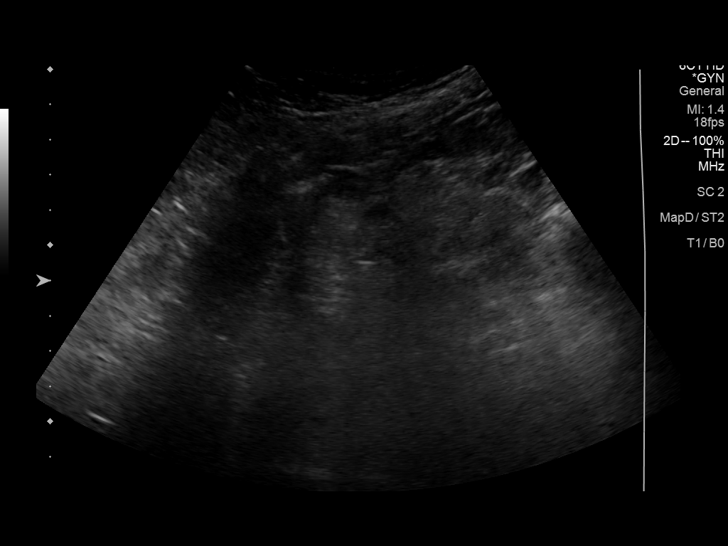
[im 27/58]
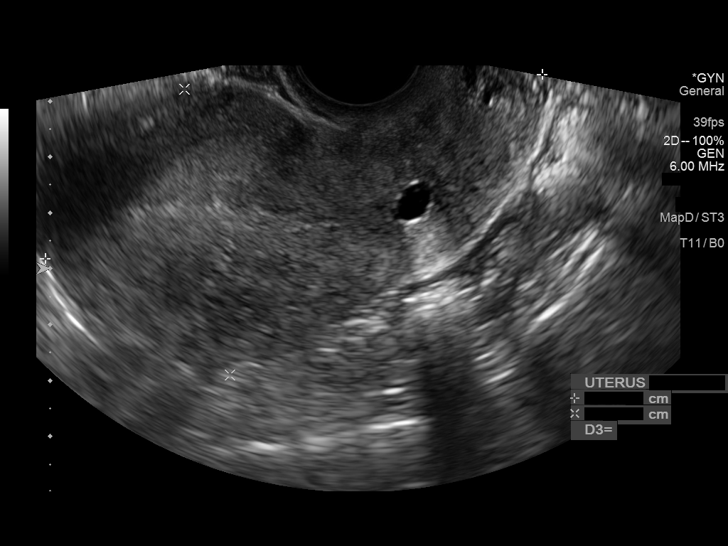
[im 31/58]
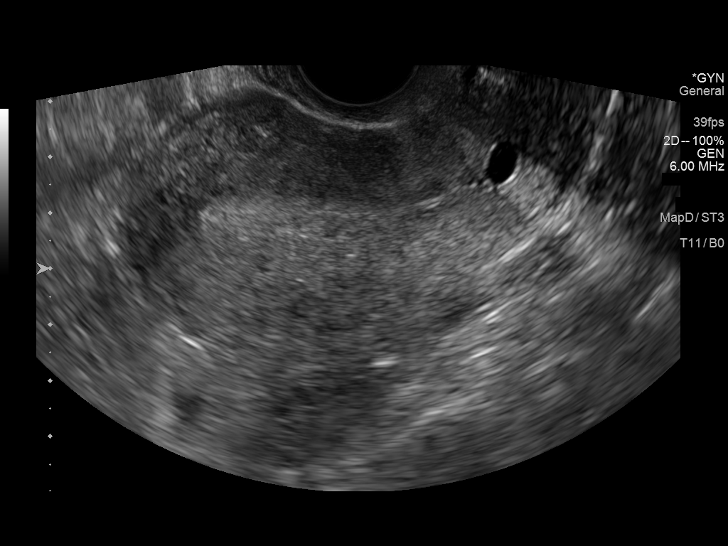
[im 36/58]
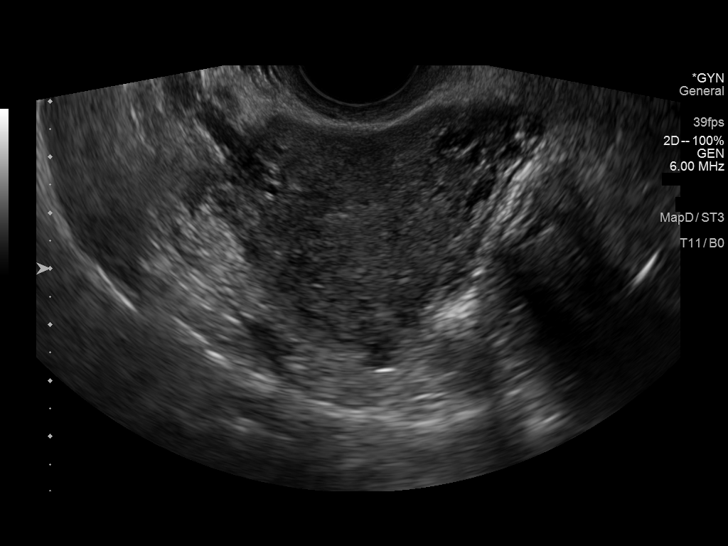
[im 39/58]
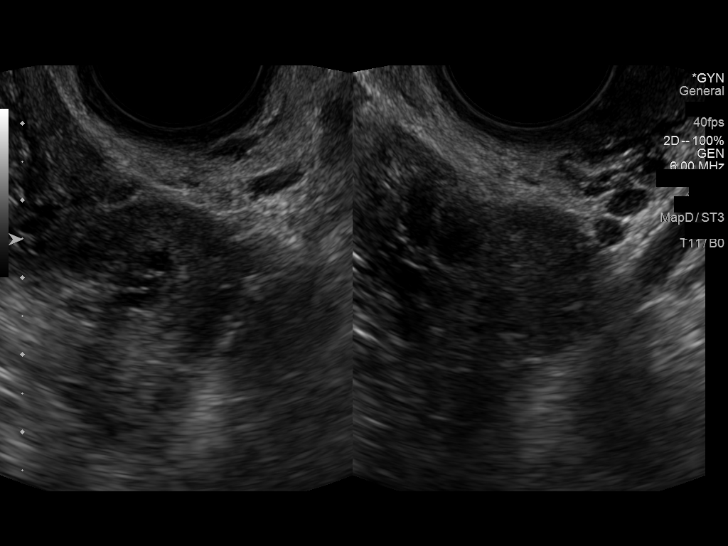
[im 43/58]
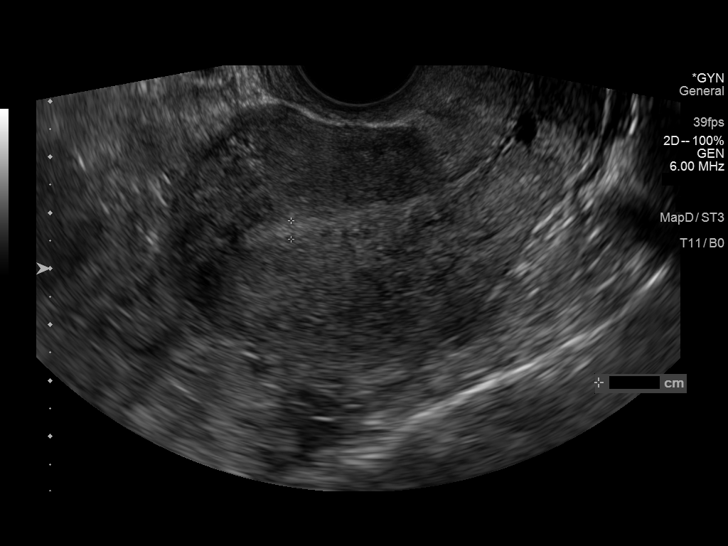
[im 48/58]
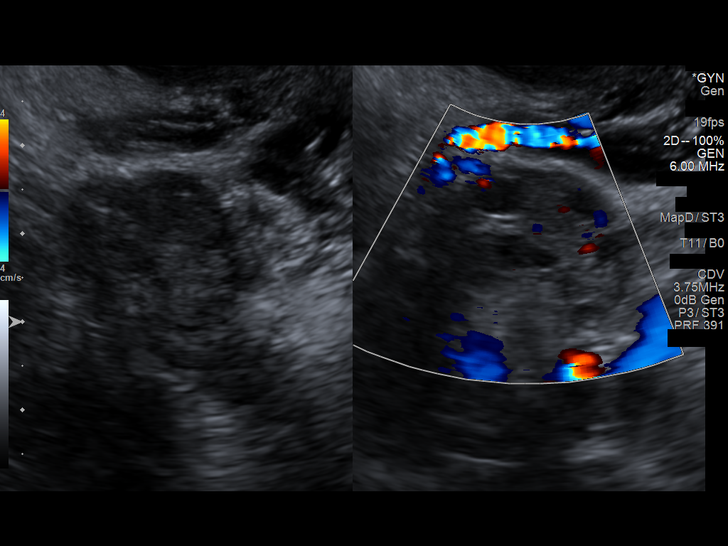
[im 53/58]
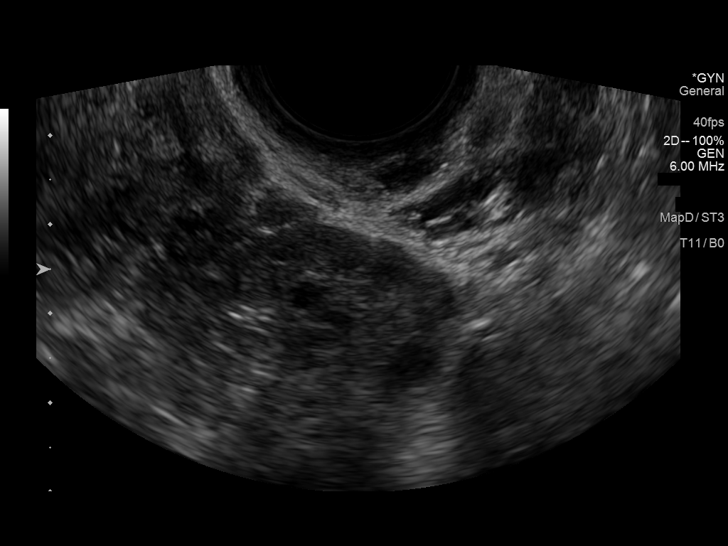
[im 58/58]
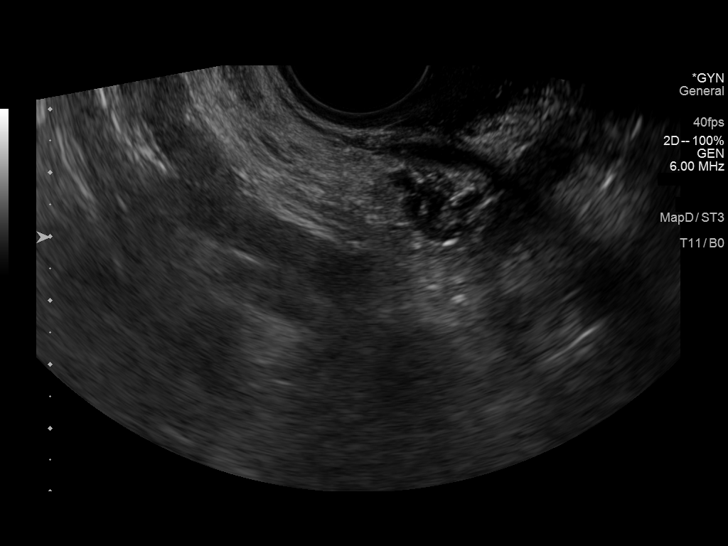

[14 of 25 positions shown; findings below may reference images not displayed]

FINDINGS: Uterus

Measurements: 10.6 x 4.3 x 5.2 cm = volume: 124 mL. No fibroids or
other mass visualized.

Endometrium

Thickness: 0.3 cm.  No focal abnormality visualized.

Right ovary

Measurements: 2.0 x 2.1 x 2.6 cm = volume: 5.4 mL. Normal
appearance/no adnexal mass.

Left ovary

Measurements: 2.2 x 1.5 x 2.1 cm = volume: 3.8 mL. Normal
appearance/no adnexal mass.

Other findings

No abnormal free fluid.
IMPRESSION: Normal examination.

## 2022-02-06 ENCOUNTER — Ambulatory Visit (HOSPITAL_COMMUNITY)
Admission: EM | Admit: 2022-02-06 | Discharge: 2022-02-06 | Disposition: A | Discharge status: Home / Self Care | Payer: No Payment, Other | Attending: Psychiatry | Admitting: Psychiatry

## 2022-02-06 DIAGNOSIS — F4325 Adjustment disorder with mixed disturbance of emotions and conduct: Secondary | ICD-10-CM | POA: Insufficient documentation

## 2022-02-06 DIAGNOSIS — F32A Depression, unspecified: Secondary | ICD-10-CM | POA: Insufficient documentation
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
## 2022-02-06 NOTE — Discharge Summary (Signed)
Amanda Jarvis to be D/C'd Home per FNP order.An After Visit Summary was printed and given to the patient by provider. Patient escorted out and D/C home via private auto.  ?Amanda Jarvis  Amanda Jarvis  ?02/06/2022 1:19 PM ?  ?   ?

## 2022-02-06 NOTE — Discharge Instructions (Addendum)

## 2022-02-26 ENCOUNTER — Telehealth (HOSPITAL_COMMUNITY): Payer: Self-pay

## 2022-02-26 NOTE — BH Assessment (Signed)
Care Management - Follow Up [facility] Discharges   Writer made contact with the patient. Patient reports that she would schedule an appointment to speak to a mental health or substance abuse providers.

## 2022-03-07 ENCOUNTER — Other Ambulatory Visit: Payer: Self-pay

## 2022-03-07 ENCOUNTER — Encounter (HOSPITAL_COMMUNITY): Payer: Self-pay

## 2022-03-07 ENCOUNTER — Emergency Department (HOSPITAL_COMMUNITY): Payer: Medicaid Other

## 2022-03-07 ENCOUNTER — Emergency Department (HOSPITAL_COMMUNITY)
Admission: EM | Admit: 2022-03-07 | Discharge: 2022-03-08 | Disposition: A | Discharge status: Home / Self Care | Payer: Medicaid Other | Attending: Emergency Medicine | Admitting: Emergency Medicine

## 2022-03-07 DIAGNOSIS — K429 Umbilical hernia without obstruction or gangrene: Secondary | ICD-10-CM | POA: Insufficient documentation

## 2022-03-07 DIAGNOSIS — K439 Ventral hernia without obstruction or gangrene: Secondary | ICD-10-CM | POA: Insufficient documentation

## 2022-03-07 LAB — COMPREHENSIVE METABOLIC PANEL
ALT: 38 U/L (ref 0–44)
AST: 28 U/L (ref 15–41)
Albumin: 4.1 g/dL (ref 3.5–5.0)
Alkaline Phosphatase: 54 U/L (ref 38–126)
Anion gap: 8 (ref 5–15)
BUN: 16 mg/dL (ref 6–20)
CO2: 24 mmol/L (ref 22–32)
Calcium: 9.3 mg/dL (ref 8.9–10.3)
Chloride: 109 mmol/L (ref 98–111)
Creatinine, Ser: 0.63 mg/dL (ref 0.44–1.00)
GFR, Estimated: >60 mL/min (ref >60)
Glucose, Bld: 106 mg/dL — ABNORMAL HIGH (ref 70–99)
Potassium: 3.7 mmol/L (ref 3.5–5.1)
Sodium: 141 mmol/L (ref 135–145)
Total Bilirubin: 1 mg/dL (ref 0.3–1.2)
Total Protein: 7 g/dL (ref 6.5–8.1)

## 2022-03-07 LAB — CBC
HCT: 39.3 % (ref 36.0–46.0)
Hemoglobin: 13.7 g/dL (ref 12.0–15.0)
MCH: 31.9 pg (ref 26.0–34.0)
MCHC: 34.9 g/dL (ref 30.0–36.0)
MCV: 91.6 fL (ref 80.0–100.0)
Platelets: 213 10*3/uL (ref 150–400)
RBC: 4.29 MIL/uL (ref 3.87–5.11)
RDW: 13 % (ref 11.5–15.5)
WBC: 7.7 10*3/uL (ref 4.0–10.5)
nRBC: 0 % (ref 0.0–0.2)

## 2022-03-07 LAB — I-STAT BETA HCG BLOOD, ED (MC, WL, AP ONLY): I-stat hCG, quantitative: <5 m[IU]/mL (ref <5)

## 2022-03-07 MED ORDER — IOHEXOL 300 MG/ML  SOLN
100.0000 mL | Freq: Once | INTRAMUSCULAR | Status: AC | PRN
  Administered 2022-03-07: 100 mL via INTRAVENOUS

## 2022-03-07 NOTE — ED Provider Triage Note (Signed)
Emergency Medicine Provider Triage Evaluation Note  Amanda Jarvis , a 36 y.o. female  was evaluated in triage.  Pt complains of abdominal pain, difficulty with hernia in the abdomen.  Patient with umbilical hernia for the last 11 years.  She reports that she has not been able to reduce it for 1 week, increasing pain, some control of pain with Tylenol, ibuprofen but has not been helping over the last few days.  She does not take anything for pain at this time prior to arrival.  She has not noticed any significant overlying skin changes other than some increased redness.  Review of Systems  Positive: Hernia, abdominal pain Negative: Nausea, vomiting, diarrhea, fever, chills  Physical Exam  BP 118/71 (BP Location: Right Arm)   Pulse 80   Temp 98.5 F (36.9 C) (Oral)   Resp 16   Ht 5\' 5"  (1.651 m)   Wt 63.5 kg   SpO2 100%   BMI 23.30 kg/m  Gen:   Awake, no distress   Resp:  Normal effort  MSK:   Moves extremities without difficulty  Other:  Patient with around 3 cm umbilical hernia, there is some redness, and tenderness to palpation, patient will not tolerate me trying to reduce the hernia in triage  Medical Decision Making  Medically screening exam initiated at 9:00 PM.  Appropriate orders placed.  Amanda Jarvis was informed that the remainder of the evaluation will be completed by another provider, this initial triage assessment does not replace that evaluation, and the importance of remaining in the ED until their evaluation is complete.  Workup initiated   Amanda Jarvis, Amanda Jarvis 03/07/22 2101

## 2022-03-07 NOTE — ED Triage Notes (Signed)
Patient has had an umbilical hernia for 11 years. But has had terrible pain since Saturday. Painful to touch, laugh, cough.

## 2022-03-08 MED ORDER — ACETAMINOPHEN 325 MG PO TABS
650.0000 mg | ORAL_TABLET | Freq: Once | ORAL | Status: AC
Start: 2022-03-08 — End: 2022-03-08
  Administered 2022-03-08: 650 mg via ORAL
  Filled 2022-03-08: qty 2

## 2022-03-08 MED ORDER — IBUPROFEN 800 MG PO TABS
800.0000 mg | ORAL_TABLET | Freq: Once | ORAL | Status: AC
  Administered 2022-03-08: 800 mg via ORAL
  Filled 2022-03-08: qty 1

## 2022-03-08 NOTE — Discharge Instructions (Addendum)
You are seen in the ER today for your painful hernia.  This was incarcerated but was successfully reduced in the emergency department.  Please follow-up with your established surgeon to discuss further management of your hernia and return to the ER with any recurrence of your symptoms or any worsening of your pain.

## 2022-03-08 NOTE — ED Provider Notes (Signed)
[facility]-EMERGENCY DEPT Provider Note   CSN: [id number] Arrival date & time: 03/07/22  2005     History  Chief Complaint  Patient presents with   Hernia    Amanda Jarvis is a 36 y.o. female with known supraumbilical and umbilical hernia since the birth of her first child 11 years ago who presents with concern for worsening of her pain x6 days and a reducible bulge in the umbilicus.  No fevers or chills, no vomiting or diarrhea but mild nausea.  Patient unable to get into see her surgeon, prompting ED visit.  Pain does completely resolve with Tylenol at home.  Have personally reviewed this patient's medical records.  She has history of sickle cell trait and ventral hernia as above.  She is not on any medications daily.   HPI     Home Medications Prior to Admission medications   Medication Sig Start Date End Date Taking? Authorizing Provider  ibuprofen (ADVIL) 200 MG tablet Take 400 mg by mouth every 6 (six) hours as needed (For sinus headache).   Yes [provider]      Allergies    Zyrtec [cetirizine]    Review of Systems   Review of Systems  Constitutional: Negative.   HENT: Negative.    Respiratory: Negative.    Cardiovascular: Negative.   Gastrointestinal:        Abdominal wall hernia with pain  Genitourinary: Negative.    Physical Exam Updated Vital Signs BP 111/72 (BP Location: Right Arm)   Pulse (!) 54   Temp 97.9 F (36.6 C) (Oral)   Resp 16   Ht 5\' 6"  (1.676 m)   Wt 95.3 kg   SpO2 98%   BMI 33.89 kg/m  Physical Exam Vitals and nursing note reviewed.  HENT:     Head: Normocephalic and atraumatic.     Mouth/Throat:     Mouth: Mucous membranes are moist.     Pharynx: No oropharyngeal exudate or posterior oropharyngeal erythema.  Eyes:     General:        Right eye: No discharge.        Left eye: No discharge.     Conjunctiva/sclera: Conjunctivae normal.  Cardiovascular:     Rate and Rhythm: Normal rate  and regular rhythm.     Pulses: Normal pulses.     Heart sounds: Normal heart sounds. No murmur heard. Pulmonary:     Effort: Pulmonary effort is normal. No respiratory distress.     Breath sounds: Normal breath sounds. No wheezing or rales.  Abdominal:     General: Bowel sounds are normal. There is no distension.     Palpations: Abdomen is soft.     Tenderness: There is no abdominal tenderness. There is no right CVA tenderness, left CVA tenderness, guarding or rebound.     Hernia: A hernia is present. Hernia is present in the umbilical area and ventral area.    Musculoskeletal:        General: No deformity.     Cervical back: Neck supple.  Skin:    General: Skin is warm and dry.  Neurological:     Mental Status: She is alert. Mental status is at baseline.  Psychiatric:        Mood and Affect: Mood normal.    ED Results / Procedures / Treatments   Labs (all labs ordered are listed, but only abnormal results are displayed) Labs Reviewed  COMPREHENSIVE METABOLIC PANEL - Abnormal; Notable for the  following components:      Result Value   Glucose, Bld 106 (*)    All other components within normal limits  CBC  I-STAT BETA HCG BLOOD, ED ([facility], [facility], [facility] ONLY)    EKG None  Radiology CT ABDOMEN PELVIS W CONTRAST  Result Date: 03/07/2022 CLINICAL DATA:  Hernia, complicated Painful umbilical hernia. EXAM: CT ABDOMEN AND PELVIS WITH CONTRAST TECHNIQUE: Multidetector CT imaging of the abdomen and pelvis was performed using the standard protocol following bolus administration of intravenous contrast. RADIATION DOSE REDUCTION: This exam was performed according to the departmental dose-optimization program which includes automated exposure control, adjustment of the mA and/or kV according to patient size and/or use of iterative reconstruction technique. CONTRAST:  OMNIPAQUE IOHEXOL 300 MG/ML  SOLN COMPARISON:  CT 05/01/2017 FINDINGS: Lower chest: No acute airspace disease or pleural  effusion. The heart is normal in size. Hepatobiliary: Mild decreased hepatic density typical of steatosis. The liver is enlarged spanning 21.2 cm cranial caudal. No focal liver lesion. Gallbladder physiologically distended, no calcified stone. No biliary dilatation. Pancreas: Unremarkable. No pancreatic ductal dilatation or surrounding inflammatory changes. Spleen: Normal in size without focal abnormality. Adrenals/Urinary Tract: Normal adrenal glands. No hydronephrosis or perinephric edema. Homogeneous renal enhancement. No renal calculi or focal renal abnormality. Urinary bladder is physiologically distended without wall thickening. Stomach/Bowel: Stomach is within normal limits. Appendix appears normal. No evidence of bowel wall thickening, distention, or inflammatory changes. Moderate colonic stool burden. Vascular/Lymphatic: Normal caliber abdominal aorta patent portal, splenic, and mesenteric veins. No enlarged lymph nodes in the abdomen or pelvis. Reproductive: Probable small nabothian cysts in the cervix. The uterus is otherwise unremarkable. Appropriate positioning of the right tubal ligation clip, the left tubal ligation clip is located anterior to the left psoas muscle, not adjacent to the ovary. This is unchanged from prior. No adnexal mass. Other: Moderate-sized fat containing supraumbilical ventral abdominal wall hernia, similar in size to prior exam. This contains fat and vessels without inflammation. There is a small to moderate-sized umbilical hernia that contains fat, small amount of stranding both within the hernia sac and in the subjacent omental fat. There is no bowel involvement. No fluid within either hernia sac. There is also a tiny fat containing infraumbilical ventral abdominal wall hernia. No ascites. No free air. Musculoskeletal: There are no acute or suspicious osseous abnormalities. IMPRESSION: 1. Mild fat stranding small to moderate umbilical hernia suggesting acute inflammation. There  is also stranding of the subjacent mesenteric fat. The supraumbilical fat containing hernia demonstrates no inflammatory change. Mint of either of these hernias peer 2. Hepatomegaly and hepatic steatosis. 3. Appropriate positioning of the right tubal ligation clip, the left tubal ligation clip is located anterior to the left psoas muscle, not adjacent to the ovary. This is unchanged from prior exam. 4. Moderate colonic stool burden, can be seen with constipation. Electronically Signed   By: Narda Rutherford M.D.   On: 03/07/2022 22:14    Procedures Hernia reduction  Date/Time: 03/08/2022 1:51 AM Performed by: Paris Lore, PA-C Authorized by: Paris Lore, PA-C  Consent: Verbal consent obtained. Consent given by: patient Patient understanding: patient states understanding of the procedure being performed Patient consent: the patient's understanding of the procedure matches consent given Imaging studies: imaging studies available Patient identity confirmed: verbally with patient and arm band Time out: Immediately prior to procedure a "time out" was called to verify the correct patient, procedure, equipment, support staff and site/side marked as required. Local anesthesia used: no  Anesthesia: Local anesthesia used: no  Sedation: Patient sedated: no  Patient tolerance: patient tolerated the procedure well with no immediate complications Comments: Successful palpation of hernia tract with subsequent reduction of the hernia by slow steady pressure by this provider.     Medications Ordered in ED Medications  iohexol (OMNIPAQUE) 300 MG/ML solution 100 mL (100 mLs Intravenous Contrast Given 03/07/22 2200)  ibuprofen (ADVIL) tablet 800 mg (800 mg Oral Given 03/08/22 0151)  acetaminophen (TYLENOL) tablet 650 mg (650 mg Oral Given 03/08/22 0151)    ED Course/ Medical Decision Making/ A&P                           Medical Decision Making 36 year old female with known ventral  hernia and local hernia presents with concern for bulge in her bellybutton that is painful x6 days.  Vital signs are normal and intake.  Cardiopulmonary exam is normal, abdominal exam as above with 3.5 cm umbilical hernia that appears incarcerated, tenderness to palpation. No darkening of the skin or skin changes.   Differential diagnosis includes not limited to incarcerated hernia, strangulated hernia, reducible hernia, tumor.   Amount and/or Complexity of Data Reviewed Labs:     Details: CBC unremarkable, CMP unremarkable, pregnancy test negative. Radiology: independent interpretation performed.    Details: CT abdomen pelvis revealed moderate umbilical hernia that is fat-containing without bowel involvement with associated fat stranding and stranding of the subjacent mesenteric fat. Visualized by this provide.r  Risk OTC drugs. Prescription drug management.   Hernia reduced as above by this provider.  IV analgesia offered prior to procedure but patient declined requesting proceed with hernia reduction without analgesia.  Complete reduction of previously incarcerated umbilical hernia with resolution of patient's pain.  No further work-up warranted in the ER at this time.  Will recommend patient follow-up closely in the outpatient setting with her surgeons.  Dois DavenportSandra and her sister-in-law  voiced understanding of her medical evaluation and treatment plan. Each of their questions answered to their expressed satisfaction.  Return precautions were given.  Patient is well-appearing, stable, and was discharged in good condition.  This chart was dictated using voice recognition software, Dragon. Despite the best efforts of this provider to proofread and correct errors, errors may still occur which can change documentation meaning.   Final Clinical Impression(s) / ED Diagnoses Final diagnoses:  Umbilical hernia without obstruction and without gangrene    Rx / DC Orders ED Discharge Orders      None         Paris LoreSponseller, Kellene Mccleary R, PA-C 03/08/22 0152    Shon BatonHorton, Courtney F, MD 03/08/22 [phone]

## 2022-03-08 NOTE — ED Notes (Signed)
Since sat pain has been worse. Worse to touch, move, cough, laugh, etc... current pain 5 out of 10 constant. Tylenol makes it better movement makes it worse. Pain gets up to about 8 when moving and she has to put pressure to make it ease up.

## 2023-06-17 IMAGING — CT CT ABD-PELV W/ CM
2 of 4 series · 15 of 46 positions shown, 17 images · IV contrast (agent unspecified)
Comparison: CT 05/01/2017

CLINICAL DATA: Hernia, complicated

Painful umbilical hernia.
EXAM:
CT ABDOMEN AND PELVIS WITH CONTRAST
TECHNIQUE: Multidetector CT imaging of the abdomen and pelvis was performed
using the standard protocol following bolus administration of
intravenous contrast.

[Series 2: axial st · axial · 0.85mm/px · z∈[+1172,+1577]mm · 12 of 93 slices shown, 14 images]
[im 6/93  soft-tissue]
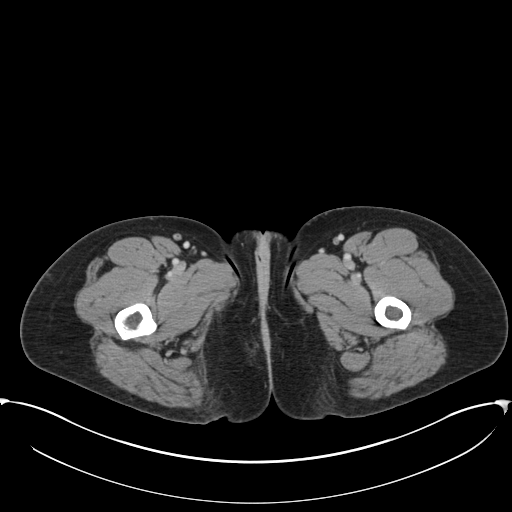
[im 6/93  bone]
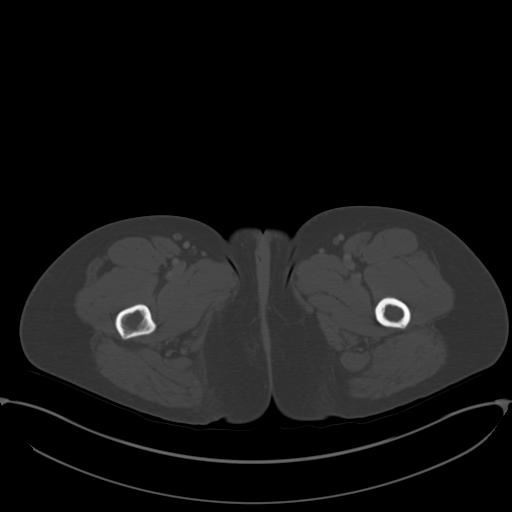
[im 16/93  soft-tissue]
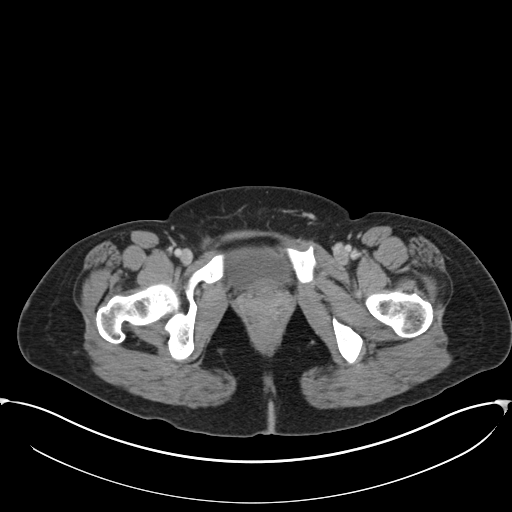
[im 21/93  soft-tissue]
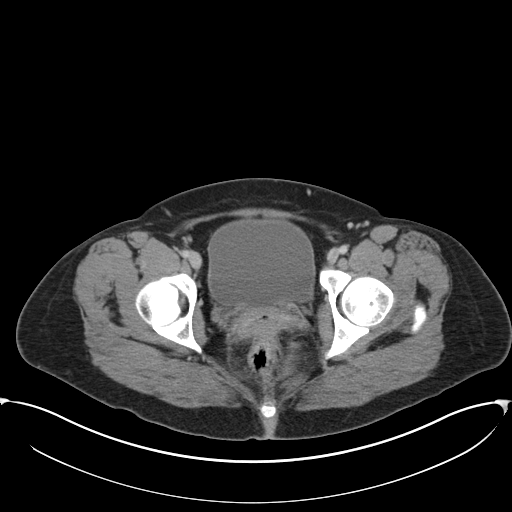
[im 26/93  soft-tissue]
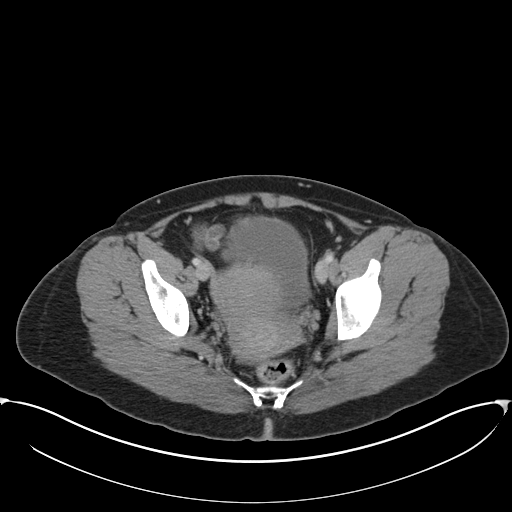
[im 36/93  soft-tissue]
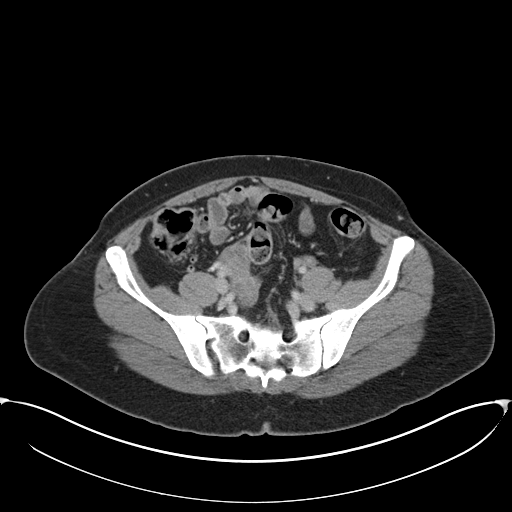
[im 41/93  soft-tissue]
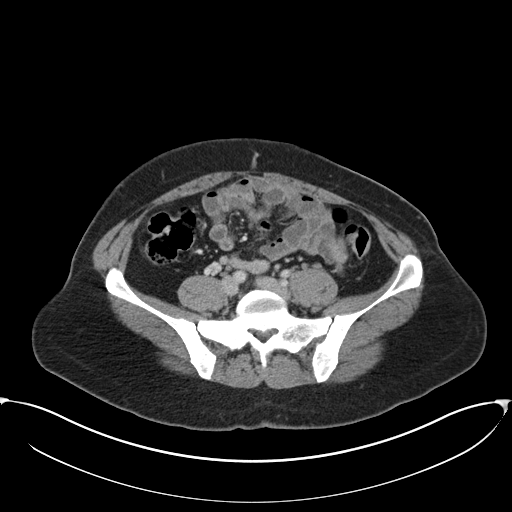
[im 52/93  soft-tissue]
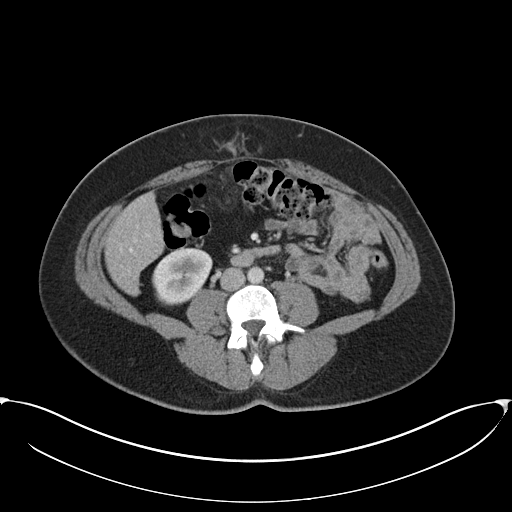
[im 57/93  soft-tissue]
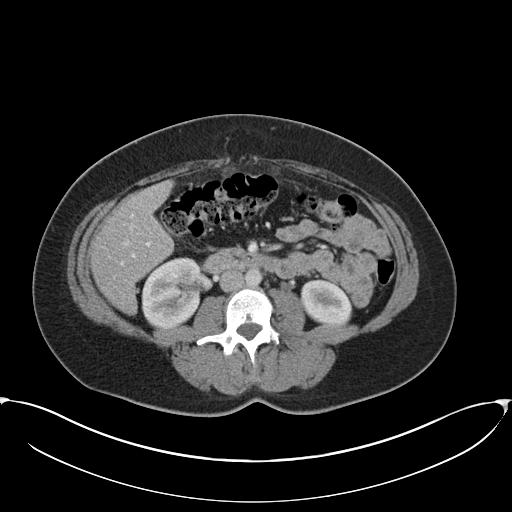
[im 67/93  soft-tissue]
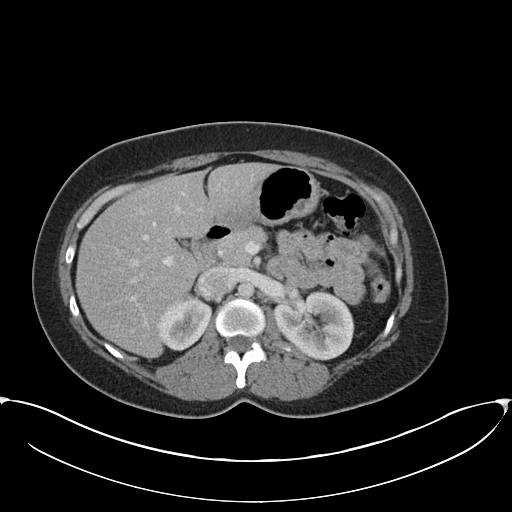
[im 67/93  bone]
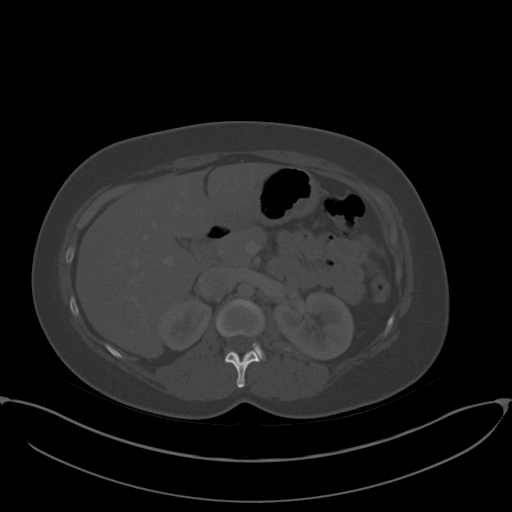
[im 72/93  soft-tissue]
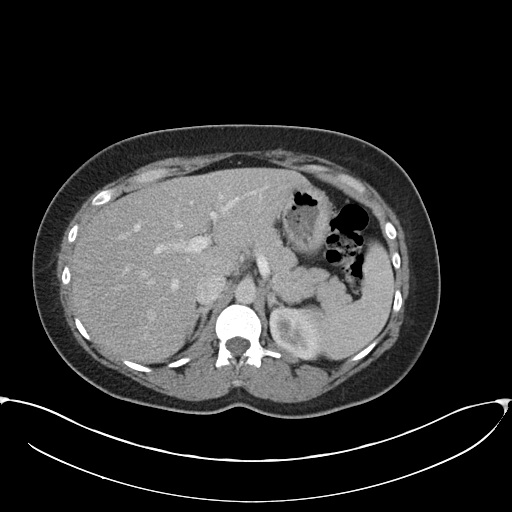
[im 77/93  soft-tissue]
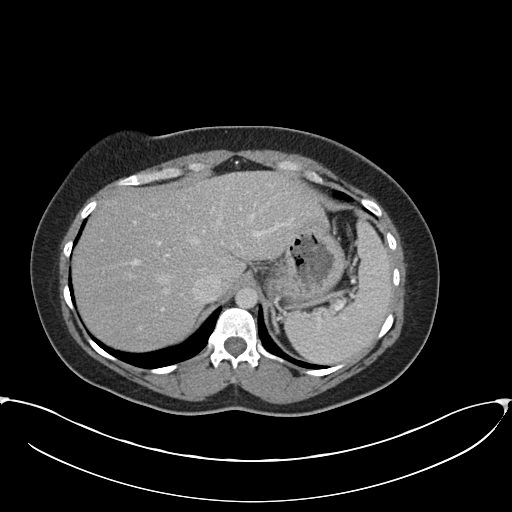
[im 87/93  soft-tissue]
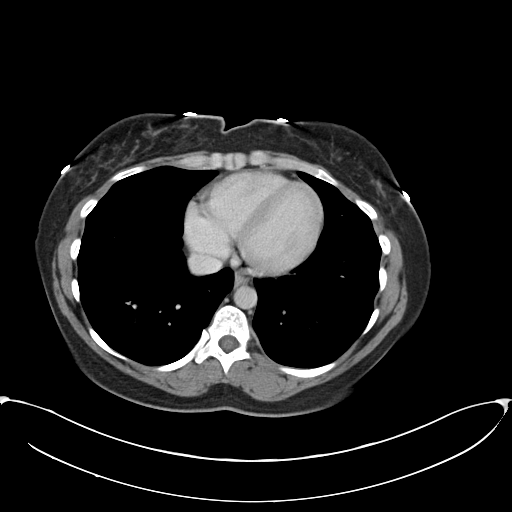

[Series 5: coronal st · coronal · 0.74mm/px · 3 of 150 slices shown]
[im 50/150  soft-tissue]
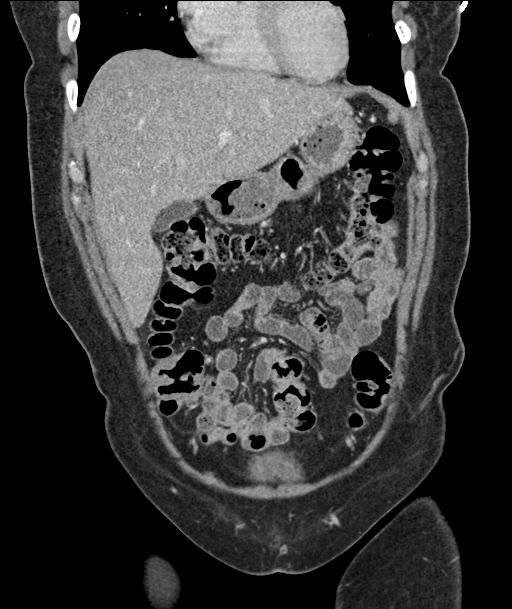
[im 67/150  soft-tissue]
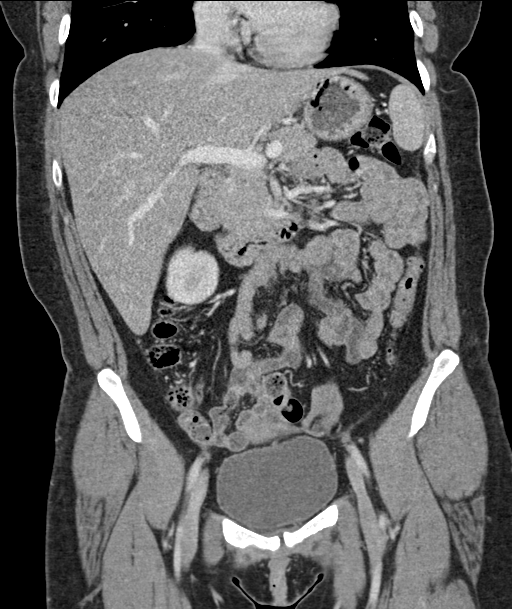
[im 83/150  soft-tissue]
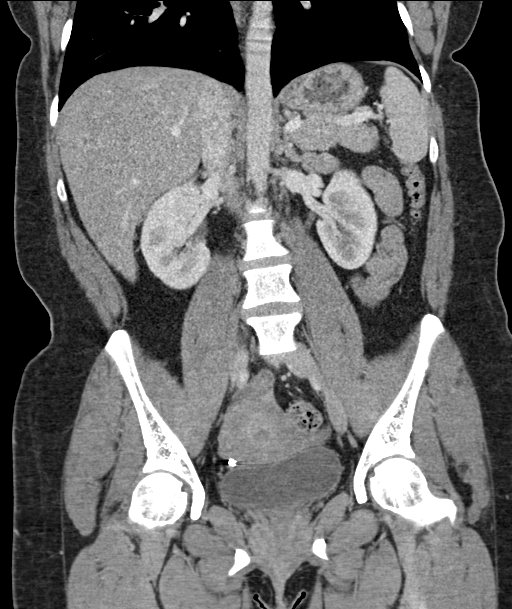

[15 of 46 positions shown; findings below may reference images not displayed]

RADIATION DOSE REDUCTION: This exam was performed according to the
departmental dose-optimization program which includes automated
exposure control, adjustment of the mA and/or kV according to
patient size and/or use of iterative reconstruction technique.

CONTRAST:  100mL OMNIPAQUE IOHEXOL 300 MG/ML  SOLN
FINDINGS: Lower chest: No acute airspace disease or pleural effusion. The
heart is normal in size.

Hepatobiliary: Mild decreased hepatic density typical of steatosis.
The liver is enlarged spanning 21.2 cm cranial caudal. No focal
liver lesion. Gallbladder physiologically distended, no calcified
stone. No biliary dilatation.

Pancreas: Unremarkable. No pancreatic ductal dilatation or
surrounding inflammatory changes.

Spleen: Normal in size without focal abnormality.

Adrenals/Urinary Tract: Normal adrenal glands. No hydronephrosis or
perinephric edema. Homogeneous renal enhancement. No renal calculi
or focal renal abnormality. Urinary bladder is physiologically
distended without wall thickening.

Stomach/Bowel: Stomach is within normal limits. Appendix appears
normal. No evidence of bowel wall thickening, distention, or
inflammatory changes. Moderate colonic stool burden.

Vascular/Lymphatic: Normal caliber abdominal aorta patent portal,
splenic, and mesenteric veins. No enlarged lymph nodes in the
abdomen or pelvis.

Reproductive: Probable small nabothian cysts in the cervix. The
uterus is otherwise unremarkable. Appropriate positioning of the
right tubal ligation clip, the left tubal ligation clip is located
anterior to the left psoas muscle, not adjacent to the ovary. This
is unchanged from prior. No adnexal mass.

Other: Moderate-sized fat containing supraumbilical ventral
abdominal wall hernia, similar in size to prior exam. This contains
fat and vessels without inflammation. There is a small to
moderate-sized umbilical hernia that contains fat, small amount of
stranding both within the hernia sac and in the subjacent omental
fat. There is no bowel involvement. No fluid within either hernia
sac. There is also a tiny fat containing infraumbilical ventral
abdominal wall hernia. No ascites. No free air.

Musculoskeletal: There are no acute or suspicious osseous
abnormalities.
IMPRESSION: 1. Mild fat stranding small to moderate umbilical hernia suggesting
acute inflammation. There is also stranding of the subjacent
mesenteric fat. The supraumbilical fat containing hernia
demonstrates no inflammatory change. Mint of either of these hernias
peer
2. Hepatomegaly and hepatic steatosis.
3. Appropriate positioning of the right tubal ligation clip, the
left tubal ligation clip is located anterior to the left psoas
muscle, not adjacent to the ovary. This is unchanged from prior
exam.
4. Moderate colonic stool burden, can be seen with constipation.

## 2023-12-08 ENCOUNTER — Ambulatory Visit: Admitting: Family Medicine

## 2023-12-11 ENCOUNTER — Ambulatory Visit (INDEPENDENT_AMBULATORY_CARE_PROVIDER_SITE_OTHER): Admitting: Family Medicine

## 2023-12-11 ENCOUNTER — Encounter: Payer: Self-pay | Admitting: Family Medicine

## 2023-12-11 VITALS — BP 118/70 | HR 79 | Resp 12 | Ht 66.0 in | Wt 171.0 lb

## 2023-12-11 DIAGNOSIS — K429 Umbilical hernia without obstruction or gangrene: Secondary | ICD-10-CM | POA: Diagnosis not present

## 2023-12-11 DIAGNOSIS — K760 Fatty (change of) liver, not elsewhere classified: Secondary | ICD-10-CM

## 2023-12-11 NOTE — Progress Notes (Signed)
 ACUTE VISIT Chief Complaint  Patient presents with   Referral   HPI: Amanda Jarvis is a 38 y.o. female with a PMHx significant for ventral hernia without obstruction or gangrene, who is here today to discuss for a referral for her hernia repair.  Hx of abdominal wall hernia. Evaluated bu surgeon in 01/2021, she decided to postpone surgery.   Patient states her umbilical hernia causes some pain and tightness in her abdomen. She was told the last time she saw the surgeon that she had 2 hernias. She mentions she is nervous for surgery due to a problem with anesthesia during her past tubal ligation surgery. She needs a new referral because her insurance has changed since the last referral she received.  Denies fever,chills, nausea, vomiting, changes in her bowel habits, melena, or blood in her stool.   Impression from Abdominal CT from 03/07/2022:  1.) Mild fat stranding small to moderate umbilical hernia suggesting acute inflammation. There is also stranding of the subjacent mesenteric fat. The supraumbilical fat containing hernia demonstrates no inflammatory change. Mint either of these hernias peer.  2.) Hepatomegaly and hepatic steatosis.  3.) Appropriate positioning of the right tubal ligation clip, the left tubal ligation clip is located anterior to the left psoas muscle, not adjacent to the ovary. This is unchanged from prior exam.  4.) Moderate colonic stool burden, can be seen with constipation.   Hepatic steatosis and hepatomegaly. Reports hep vaccination up to date. No hx of alcohol use. She has not noted jaundice or skin pruritus.  Lab Results  Component Value Date   ALT 38 03/07/2022   AST 28 03/07/2022   ALKPHOS 54 03/07/2022   BILITOT 1.0 03/07/2022   Review of Systems  Constitutional:  Negative for activity change, appetite change and unexpected weight change.  HENT:  Negative for mouth sores, nosebleeds and sore throat.   Respiratory:  Negative for cough  and shortness of breath.   Cardiovascular:  Negative for chest pain, palpitations and leg swelling.  Genitourinary:  Negative for decreased urine volume, dysuria and hematuria.  Skin:  Negative for rash.  Neurological:  Negative for syncope, weakness and headaches.  See other pertinent positives and negatives in HPI.  Current Outpatient Medications on File Prior to Visit  Medication Sig Dispense Refill   ibuprofen (ADVIL) 200 MG tablet Take 400 mg by mouth every 6 (six) hours as needed (For sinus headache).     No current facility-administered medications on file prior to visit.    Past Medical History:  Diagnosis Date   Anemia    with 1st pregnancy   Medical history non-contributory    Sickle cell trait (HCC)    Allergies  Allergen Reactions   Zyrtec [Cetirizine] Other (See Comments)    Congestion    Social History   Socioeconomic History   Marital status: Single    Spouse name: Not on file   Number of children: Not on file   Years of education: Not on file   Highest education level: Not on file  Occupational History   Not on file  Tobacco Use   Smoking status: Former    Current packs/day: 0.00    Types: Cigarettes    Quit date: 04/23/2021    Years since quitting: 2.6   Smokeless tobacco: Never  Substance and Sexual Activity   Alcohol use: Not Currently    Alcohol/week: 4.0 standard drinks of alcohol    Types: 4 Glasses of wine per week   Drug  use: No   Sexual activity: Yes    Birth control/protection: None  Other Topics Concern   Not on file  Social History Narrative   Not on file   Social Drivers of Health   Financial Resource Strain: Not on file  Food Insecurity: Not on file  Transportation Needs: Not on file  Physical Activity: Not on file  Stress: Not on file  Social Connections: Not on file    Vitals:   12/11/23 1548  BP: 118/70  Pulse: 79  Resp: 12  SpO2: 98%   Body mass index is 27.6 kg/m.  Physical Exam Vitals and nursing note  reviewed.  Constitutional:      General: She is not in acute distress.    Appearance: She is well-developed.  HENT:     Head: Normocephalic and atraumatic.  Eyes:     Conjunctiva/sclera: Conjunctivae normal.  Cardiovascular:     Rate and Rhythm: Normal rate and regular rhythm.     Heart sounds: No murmur heard. Pulmonary:     Effort: Pulmonary effort is normal. No respiratory distress.     Breath sounds: Normal breath sounds.  Abdominal:     Palpations: Abdomen is soft. There is no hepatomegaly or mass.     Tenderness: There is no abdominal tenderness.     Hernia: A hernia (Abdominal hernias x 2, umbilical and periumbilical. Both reducible, mild discomfort.) is present. Hernia is present in the umbilical area.     Comments:  ,  Skin:    General: Skin is warm.     Findings: No erythema or rash.  Neurological:     General: No focal deficit present.     Mental Status: She is alert and oriented to person, place, and time.     Cranial Nerves: No cranial nerve deficit.     Gait: Gait normal.  Psychiatric:        Mood and Affect: Mood and affect normal.   ASSESSMENT AND PLAN:  Amanda Jarvis was seen today to discuss a referral for her hernia.  Lab Results  Component Value Date   ALT 72 (H) 12/11/2023   AST 45 (H) 12/11/2023   ALKPHOS 61 12/11/2023   BILITOT 0.7 12/11/2023   Hepatic steatosis We discussed results of abdominal CT done in 03/2022, reported hepatomegaly as well as hepatic steatosis. Wt loss , low fat diet,and decreasing carbs intake will help. Continue avoiding alcohol intake.  Further recommendations according to liver US and hepatic panel results.  -     US ABDOMEN LIMITED RUQ (LIVER/GB); Future -     Hepatic function panel; Future  Umbilical hernia without obstruction and without gangrene Having mild pain, she is interested in having hernia repair. Instructed about warning signs. Surgical consultation will be arranged.  -     Ambulatory referral to  General Surgery  I spent a total of 31 minutes in both face to face and non face to face activities for this visit on the date of this encounter. During this time history was obtained and documented, examination was performed, prior labs/imaging reviewed, and assessment/plan discussed.  Return if symptoms worsen or fail to improve.  I, Rolla Etienne Wierda, acting as a scribe for Josselyn Harkins [place], MD., have documented all relevant documentation on the behalf of Sidrah Harden [place], MD, as directed by  Prentiss Polio [place], MD while in the presence of Alexy Heldt [place], MD.   I, Dekendrick Uzelac [place], MD, have reviewed all documentation for this visit. The documentation on 12/11/23 for the  exam, diagnosis, procedures, and orders are all accurate and complete.  Shirley Bolle G. [place], MD  [facility]. [place] office.

## 2023-12-11 NOTE — Patient Instructions (Addendum)
 A few things to remember from today's visit:  Hepatic steatosis - Plan: US ABDOMEN LIMITED RUQ (LIVER/GB), Hepatic function panel, CANCELED: Hepatic function panel  Umbilical hernia without obstruction and without gangrene - Plan: Ambulatory referral to General Surgery  Do not use My Chart to request refills or for acute issues that need immediate attention. If you send a my chart message, it may take a few days to be addressed, specially if I am not in the office.  Please be sure medication list is accurate. If a new problem present, please set up appointment sooner than planned today.

## 2023-12-12 ENCOUNTER — Encounter: Payer: Self-pay | Admitting: Family Medicine

## 2023-12-12 LAB — HEPATIC FUNCTION PANEL
ALT: 72 IU/L — ABNORMAL HIGH (ref 0–32)
AST: 45 IU/L — ABNORMAL HIGH (ref 0–40)
Albumin: 4.4 g/dL (ref 3.9–4.9)
Alkaline Phosphatase: 61 IU/L (ref 44–121)
Bilirubin Total: 0.7 mg/dL (ref 0.0–1.2)
Bilirubin, Direct: 0.21 mg/dL (ref 0.00–0.40)
Total Protein: 6.7 g/dL (ref 6.0–8.5)

## 2023-12-17 ENCOUNTER — Ambulatory Visit
Admission: RE | Admit: 2023-12-17 | Discharge: 2023-12-17 | Discharge status: Home / Self Care | Source: Ambulatory Visit | Attending: Family Medicine | Admitting: Family Medicine

## 2023-12-17 DIAGNOSIS — K760 Fatty (change of) liver, not elsewhere classified: Secondary | ICD-10-CM

## 2023-12-19 ENCOUNTER — Encounter: Payer: Self-pay | Admitting: Family Medicine

## 2023-12-19 ENCOUNTER — Other Ambulatory Visit: Payer: Self-pay | Admitting: Family Medicine

## 2023-12-19 DIAGNOSIS — K760 Fatty (change of) liver, not elsewhere classified: Secondary | ICD-10-CM

## 2024-02-12 ENCOUNTER — Ambulatory Visit: Payer: Self-pay | Admitting: General Surgery

## 2024-02-12 NOTE — Progress Notes (Signed)
 Surgery orders requested via Epic inbox.

## 2024-02-16 NOTE — Patient Instructions (Signed)
 SURGICAL WAITING ROOM VISITATION  Patients having surgery or a procedure may have no more than 2 support people in the waiting area - these visitors may rotate.    Children under the age of 32 must have an adult with them who is not the patient.  Due to an increase in RSV and influenza rates and associated hospitalizations, children ages 78 and under may not visit patients in [facility] hospitals.  Visitors with respiratory illnesses are discouraged from visiting and should remain at home.  If the patient needs to stay at the hospital during part of their recovery, the visitor guidelines for inpatient rooms apply. Pre-op nurse will coordinate an appropriate time for 1 support person to accompany patient in pre-op.  This support person may not rotate.    Please refer to the [facility] website for the visitor guidelines for Inpatients (after your surgery is over and you are in a regular room).       Your procedure is scheduled on: 03/02/24   Report to [facility] Main Entrance    Report to admitting at 6:15 AM   Call this number if you have problems the morning of surgery [phone]   Do not eat food :After Midnight.   After Midnight you may have the following liquids until 5:30 AM DAY OF SURGERY  Water Non-Citrus Juices (without pulp, NO RED-Apple, White grape, White cranberry) Black Coffee (NO MILK/CREAM OR CREAMERS, sugar ok)  Clear Tea (NO MILK/CREAM OR CREAMERS, sugar ok) regular and decaf                             Plain Jell-O (NO RED)                                           Fruit ices (not with fruit pulp, NO RED)                                     Popsicles (NO RED)                                                               Sports drinks like Gatorade (NO RED)                FOLLOW BOWEL PREP AND ANY ADDITIONAL PRE OP INSTRUCTIONS YOU RECEIVED FROM YOUR SURGEON'S OFFICE!!!     Oral Hygiene is also important to reduce your risk of infection.                                     Remember - BRUSH YOUR TEETH THE MORNING OF SURGERY WITH YOUR REGULAR TOOTHPASTE   Stop all vitamins and herbal supplements 7 days before surgery.   Take these medicines the morning of surgery with A SIP OF WATER: none             You may not have any metal on your body including hair pins, jewelry, and body piercing  Do not wear make-up, lotions, powders, perfumes/cologne, or deodorant  Do not wear nail polish including gel and S&S, artificial/acrylic nails, or any other type of covering on natural nails including finger and toenails. If you have artificial nails, gel coating, etc. that needs to be removed by a nail salon please have this removed prior to surgery or surgery may need to be canceled/ delayed if the surgeon/ anesthesia feels like they are unable to be safely monitored.   Do not shave  48 hours prior to surgery.    Do not bring valuables to the hospital. [place] IS NOT             RESPONSIBLE   FOR VALUABLES.   Contacts, glasses, dentures or bridgework may not be worn into surgery.  DO NOT BRING YOUR HOME MEDICATIONS TO THE HOSPITAL. PHARMACY WILL DISPENSE MEDICATIONS LISTED ON YOUR MEDICATION LIST TO YOU DURING YOUR ADMISSION IN THE HOSPITAL!    Patients discharged on the day of surgery will not be allowed to drive home.  Someone NEEDS to stay with you for the first 24 hours after anesthesia.   Special Instructions: Bring a copy of your healthcare power of attorney and living will documents the day of surgery if you haven't scanned them before.              Please read over the following fact sheets you were given: IF YOU HAVE QUESTIONS ABOUT YOUR PRE-OP INSTRUCTIONS PLEASE CALL [phone]   If you received a COVID test during your pre-op visit  it is requested that you wear a mask when out in public, stay away from anyone that may not be feeling well and notify your surgeon if you develop symptoms. If you test positive for Covid  or have been in contact with anyone that has tested positive in the last 10 days please notify you surgeon.    [place] - Preparing for Surgery Before surgery, you can play an important role.  Because skin is not sterile, your skin needs to be as free of germs as possible.  You can reduce the number of germs on your skin by washing with CHG (chlorahexidine gluconate) soap before surgery.  CHG is an antiseptic cleaner which kills germs and bonds with the skin to continue killing germs even after washing. Please DO NOT use if you have an allergy to CHG or antibacterial soaps.  If your skin becomes reddened/irritated stop using the CHG and inform your nurse when you arrive at Short Stay. Do not shave (including legs and underarms) for at least 48 hours prior to the first CHG shower.  You may shave your face/neck.  Please follow these instructions carefully:  1.  Shower with CHG Soap the night before surgery and the  morning of surgery.  2.  If you choose to wash your hair, wash your hair first as usual with your normal  shampoo.  3.  After you shampoo, rinse your hair and body thoroughly to remove the shampoo.                             4.  Use CHG as you would any other liquid soap.  You can apply chg directly to the skin and wash.  Gently with a scrungie or clean washcloth.  5.  Apply the CHG Soap to your body ONLY FROM THE NECK DOWN.   Do   not use on face/ open  Wound or open sores. Avoid contact with eyes, ears mouth and   genitals (private parts).                       Wash face,  Genitals (private parts) with your normal soap.             6.  Wash thoroughly, paying special attention to the area where your    surgery  will be performed.  7.  Thoroughly rinse your body with warm water from the neck down.  8.  DO NOT shower/wash with your normal soap after using and rinsing off the CHG Soap.                9.  Pat yourself dry with a clean towel.            10.  Wear  clean pajamas.            11.  Place clean sheets on your bed the night of your first shower and do not  sleep with pets. Day of Surgery : Do not apply any lotions/deodorants the morning of surgery.  Please wear clean clothes to the hospital/surgery center.  FAILURE TO FOLLOW THESE INSTRUCTIONS MAY RESULT IN THE CANCELLATION OF YOUR SURGERY  PATIENT SIGNATURE_________________________________  NURSE SIGNATURE__________________________________  ________________________________________________________________________

## 2024-02-16 NOTE — Progress Notes (Signed)
 COVID Vaccine received:  []  No []  Yes Date of any COVID positive Test in last 90 days:  PCP - Betty Swaziland MD Cardiologist -   Chest x-ray -  EKG -   Stress Test -  ECHO -  Cardiac Cath -   Bowel Prep - []  No  []   Yes ______  Pacemaker / ICD device []  No []  Yes   Spinal Cord Stimulator:[]  No []  Yes       History of Sleep Apnea? []  No []  Yes   CPAP used?- []  No []  Yes    Does the patient monitor blood sugar?          []  No []  Yes  []  N/A  Patient has: []  NO Hx DM   []  Pre-DM                 []  DM1  []   DM2 Does patient have a [employer] or [employer]om? []  No []  Yes   Fasting Blood Sugar Ranges-  Checks Blood Sugar _____ times a day  GLP1 agonist / usual dose -  GLP1 instructions:  SGLT-2 inhibitors / usual dose -  SGLT-2 instructions:   Blood Thinner / Instructions: Aspirin Instructions:  Comments:   Activity level: Patient is able / unable to climb a flight of stairs without difficulty; []  No CP  []  No SOB, but would have ___   Patient can / can not perform ADLs without assistance.   Anesthesia review:   Patient denies shortness of breath, fever, cough and chest pain at PAT appointment.  Patient verbalized understanding and agreement to the Pre-Surgical Instructions that were given to them at this PAT appointment. Patient was also educated of the need to review these PAT instructions again prior to his/her surgery.I reviewed the appropriate phone numbers to call if they have any and questions or concerns.

## 2024-02-19 ENCOUNTER — Encounter (HOSPITAL_COMMUNITY)
Admission: RE | Admit: 2024-02-19 | Discharge: 2024-02-19 | Disposition: A | Discharge status: Home / Self Care | Source: Ambulatory Visit | Attending: Family Medicine | Admitting: Family Medicine

## 2024-02-25 NOTE — Patient Instructions (Signed)
 SURGICAL WAITING ROOM VISITATION Patients having surgery or a procedure may have no more than 2 support people in the waiting area - these visitors may rotate in the visitor waiting room.   If the patient needs to stay at the hospital during part of their recovery, the visitor guidelines for inpatient rooms apply.  PRE-OP VISITATION  Pre-op nurse will coordinate an appropriate time for 1 support person to accompany the patient in pre-op.  This support person may not rotate.  This visitor will be contacted when the time is appropriate for the visitor to come back in the pre-op area.  Please refer to the [facility] website for the visitor guidelines for Inpatients (after your surgery is over and you are in a regular room).  You are not required to quarantine at this time prior to your surgery. However, you must do this: Hand Hygiene often Do NOT share personal items Notify your provider if you are in close contact with someone who has COVID or you develop fever 100.4 or greater, new onset of sneezing, cough, sore throat, shortness of breath or body aches.  If you test positive for Covid or have been in contact with anyone that has tested positive in the last 10 days please notify you surgeon.    Your procedure is scheduled on:   [facility]  Mar 02, 2024  Report to [facility] Main Entrance: [address] entrance where the [employer] is available.   Report to admitting at: 06:15    AM  Call this number if you have any questions or problems the morning of surgery [phone]  FOLLOW ANY ADDITIONAL PRE OP INSTRUCTIONS YOU RECEIVED FROM YOUR SURGEON'S OFFICE!!!  Do not eat food after Midnight the night prior to your surgery/procedure.  After Midnight you may have the following liquids until  05:30 AM  DAY OF SURGERY  Clear Liquid Diet Water Black Coffee (sugar ok, NO MILK/CREAM OR CREAMERS)  Tea (sugar ok, NO MILK/CREAM OR CREAMERS) regular and decaf                              Plain Jell-O  with no fruit (NO RED)                                           Fruit ices (not with fruit pulp, NO RED)                                     Popsicles (NO RED)                                                                  Juice: NO CITRUS JUICES: only apple, WHITE grape, WHITE cranberry Sports drinks like Gatorade or Powerade (NO RED)               Oral Hygiene is also important to reduce your risk of infection.        Remember - BRUSH YOUR TEETH THE MORNING OF SURGERY WITH YOUR REGULAR TOOTHPASTE  Do NOT  smoke after Midnight the night before surgery.  STOP TAKING all Vitamins, Herbs and supplements 1 week before your surgery.   Take ONLY these medicines the morning of surgery with A SIP OF WATER: none                   You may not have any metal on your body including hair pins, jewelry, and body piercing  Do not wear make-up, lotions, powders, perfumes  or deodorant  Do not wear nail polish including gel and S&S, artificial / acrylic nails, or any other type of covering on natural nails including finger and toenails. If you have artificial nails, gel coating, etc., that needs to be removed by a nail salon, Please have this removed prior to surgery. Not doing so may mean that your surgery could be cancelled or delayed if the Surgeon or anesthesia staff feels like they are unable to monitor you safely.   Do not shave 48 hours prior to surgery to avoid nicks in your skin which may contribute to postoperative infections.   Contacts, Hearing Aids, dentures or bridgework may not be worn into surgery. DENTURES WILL BE REMOVED PRIOR TO SURGERY PLEASE DO NOT APPLY "Poly grip" OR ADHESIVES!!!  Patients discharged on the day of surgery will not be allowed to drive home.  Someone NEEDS to stay with you for the first 24 hours after anesthesia.  Do not bring your home medications to the hospital. The Pharmacy will dispense medications listed on your medication list to you during  your admission in the Hospital.  Please read over the following fact sheets you were given: IF YOU HAVE QUESTIONS ABOUT YOUR PRE-OP INSTRUCTIONS, PLEASE CALL [phone]   [facility] - Preparing for Surgery Before surgery, you can play an important role.  Because skin is not sterile, your skin needs to be as free of germs as possible.  You can reduce the number of germs on your skin by washing with CHG (chlorahexidine gluconate) soap before surgery.  CHG is an antiseptic cleaner which kills germs and bonds with the skin to continue killing germs even after washing. Please DO NOT use if you have an allergy to CHG or antibacterial soaps.  If your skin becomes reddened/irritated stop using the CHG and inform your nurse when you arrive at Short Stay. Do not shave (including legs and underarms) for at least 48 hours prior to the first CHG shower.  You may shave your face/neck.  Please follow these instructions carefully:  1.  Shower with CHG Soap the night before surgery and the  morning of surgery.  2.  If you choose to wash your hair, wash your hair first as usual with your normal  shampoo.  3.  After you shampoo, rinse your hair and body thoroughly to remove the shampoo.                             4.  Use CHG as you would any other liquid soap.  You can apply chg directly to the skin and wash.  Gently with a scrungie or clean washcloth.  5.  Apply the CHG Soap to your body ONLY FROM THE NECK DOWN.   Do not use on face/ open                           Wound or open sores. Avoid contact with eyes, ears mouth  and genitals (private parts).                       Wash face,  Genitals (private parts) with your normal soap.             6.  Wash thoroughly, paying special attention to the area where your  surgery  will be performed.  7.  Thoroughly rinse your body with warm water from the neck down.  8.  DO NOT shower/wash with your normal soap after using and rinsing off the CHG Soap.            9.  Pat  yourself dry with a clean towel.            10.  Wear clean pajamas.            11.  Place clean sheets on your bed the night of your first shower and do not  sleep with pets.  ON THE DAY OF SURGERY : Do not apply any lotions/deodorants the morning of surgery.  Please wear clean clothes to the hospital/surgery center.     FAILURE TO FOLLOW THESE INSTRUCTIONS MAY RESULT IN THE CANCELLATION OF YOUR SURGERY  PATIENT SIGNATURE_________________________________  NURSE SIGNATURE__________________________________  ________________________________________________________________________

## 2024-02-25 NOTE — Progress Notes (Addendum)
 COVID Vaccine received:  []  No [x]  Yes Date of any COVID positive Test in last 90 days:  none  PCP - Betty Swaziland, MD [phone] (Work)  [phone] (Fax)  Cardiologist - none  Chest x-ray -  EKG -   Stress Test -  ECHO -  Cardiac Cath -  CT Coronary Calcium score:   Bowel Prep - [x]  No  []   Yes ______  Pacemaker / ICD device [x]  No []  Yes   Spinal Cord Stimulator:[x]  No []  Yes       History of Sleep Apnea? [x]  No []  Yes   CPAP used?- [x]  No []  Yes    Does the patient monitor blood sugar?   [x]  N/A   []  No []  Yes  Patient has: [x]  NO Hx DM   []  Pre-DM   []  DM1  []   DM2 Last A1c was:5.3 on  11-02-2020     Blood Thinner / Instructions: none Aspirin Instructions:  none  ERAS Protocol Ordered: [x]  No  []  Yes PRE-SURGERY []  ENSURE  []  G2   [x]  No Drink Ordered Patient is to be NPO after: 0530  Dental hx: []  Dentures:  [x]  N/A      []  Bridge or Partial:                   []  Loose or Damaged teeth:   Activity level: Able to walk up 2 flights of stairs without becoming significantly short of breath or having chest pain?  []  No   [x]    Yes   Anesthesia review: Patient is scared about surgery d/t Not fully sedated w/ anes for BTL surgery, felt the first incision, ?LFTs- fatty liver, smoker some days, Remote Hx substance abuse (THC, cocaine)  Occasional ETOH  Patient denies shortness of breath, fever, cough and chest pain at PAT appointment.  Patient verbalized understanding and agreement to the Pre-Surgical Instructions that were given to them at this PAT appointment. Patient was also educated of the need to review these PAT instructions again prior to her surgery.I reviewed the appropriate phone numbers to call if they have any and questions or concerns.

## 2024-02-26 ENCOUNTER — Encounter (HOSPITAL_COMMUNITY)
Admission: RE | Admit: 2024-02-26 | Discharge: 2024-02-26 | Disposition: A | Discharge status: Home / Self Care | Source: Ambulatory Visit | Attending: Nurse Practitioner | Admitting: Nurse Practitioner

## 2024-02-26 ENCOUNTER — Encounter (HOSPITAL_COMMUNITY): Payer: Self-pay

## 2024-02-26 ENCOUNTER — Other Ambulatory Visit: Payer: Self-pay

## 2024-02-26 VITALS — BP 112/68 | HR 54 | Temp 98.5°F | Resp 16 | Ht 65.5 in | Wt 168.0 lb

## 2024-02-26 DIAGNOSIS — Z01812 Encounter for preprocedural laboratory examination: Secondary | ICD-10-CM | POA: Diagnosis present

## 2024-02-26 DIAGNOSIS — R7989 Other specified abnormal findings of blood chemistry: Secondary | ICD-10-CM | POA: Insufficient documentation

## 2024-02-26 DIAGNOSIS — K760 Fatty (change of) liver, not elsewhere classified: Secondary | ICD-10-CM | POA: Insufficient documentation

## 2024-02-26 DIAGNOSIS — Z01818 Encounter for other preprocedural examination: Secondary | ICD-10-CM

## 2024-02-26 HISTORY — DX: Disease of blood and blood-forming organs, unspecified: D75.9

## 2024-02-26 HISTORY — DX: Other specified abnormal findings of blood chemistry: R79.89

## 2024-02-26 HISTORY — DX: Other complications of anesthesia, initial encounter: T88.59XA

## 2024-02-26 HISTORY — DX: Fatty (change of) liver, not elsewhere classified: K76.0

## 2024-02-26 HISTORY — DX: Other psychoactive substance abuse, uncomplicated: F19.10

## 2024-02-26 LAB — CBC
HCT: 42.5 % (ref 36.0–46.0)
Hemoglobin: 14.6 g/dL (ref 12.0–15.0)
MCH: 31.5 pg (ref 26.0–34.0)
MCHC: 34.4 g/dL (ref 30.0–36.0)
MCV: 91.8 fL (ref 80.0–100.0)
Platelets: 230 10*3/uL (ref 150–400)
RBC: 4.63 MIL/uL (ref 3.87–5.11)
RDW: 12.3 % (ref 11.5–15.5)
WBC: 7.4 10*3/uL (ref 4.0–10.5)
nRBC: 0 % (ref 0.0–0.2)

## 2024-02-26 LAB — COMPREHENSIVE METABOLIC PANEL WITH GFR
ALT: 68 U/L — ABNORMAL HIGH (ref 0–44)
AST: 39 U/L (ref 15–41)
Albumin: 4.1 g/dL (ref 3.5–5.0)
Alkaline Phosphatase: 51 U/L (ref 38–126)
Anion gap: 8 (ref 5–15)
BUN: 8 mg/dL (ref 6–20)
CO2: 23 mmol/L (ref 22–32)
Calcium: 9.3 mg/dL (ref 8.9–10.3)
Chloride: 104 mmol/L (ref 98–111)
Creatinine, Ser: 0.6 mg/dL (ref 0.44–1.00)
GFR, Estimated: >60 mL/min (ref >60)
Glucose, Bld: 90 mg/dL (ref 70–99)
Potassium: 3.7 mmol/L (ref 3.5–5.1)
Sodium: 135 mmol/L (ref 135–145)
Total Bilirubin: 1.3 mg/dL — ABNORMAL HIGH (ref 0.0–1.2)
Total Protein: 7.1 g/dL (ref 6.5–8.1)

## 2024-03-02 ENCOUNTER — Encounter (HOSPITAL_COMMUNITY): Payer: Self-pay | Admitting: General Surgery

## 2024-03-02 ENCOUNTER — Other Ambulatory Visit: Payer: Self-pay

## 2024-03-02 ENCOUNTER — Observation Stay (HOSPITAL_COMMUNITY)
Admission: RE | Admit: 2024-03-02 | Discharge: 2024-03-03 | Disposition: A | Discharge status: Home / Self Care | Attending: General Surgery | Admitting: General Surgery

## 2024-03-02 ENCOUNTER — Encounter (HOSPITAL_COMMUNITY)
Admission: RE | Disposition: A | Discharge status: Home / Self Care | Payer: Self-pay | Source: Home / Self Care | Attending: General Surgery

## 2024-03-02 ENCOUNTER — Ambulatory Visit (HOSPITAL_COMMUNITY): Admitting: Anesthesiology

## 2024-03-02 DIAGNOSIS — M6208 Separation of muscle (nontraumatic), other site: Secondary | ICD-10-CM | POA: Insufficient documentation

## 2024-03-02 DIAGNOSIS — K432 Incisional hernia without obstruction or gangrene: Secondary | ICD-10-CM | POA: Diagnosis present

## 2024-03-02 DIAGNOSIS — K439 Ventral hernia without obstruction or gangrene: Secondary | ICD-10-CM | POA: Diagnosis present

## 2024-03-02 DIAGNOSIS — K429 Umbilical hernia without obstruction or gangrene: Secondary | ICD-10-CM | POA: Diagnosis not present

## 2024-03-02 DIAGNOSIS — Z87891 Personal history of nicotine dependence: Secondary | ICD-10-CM | POA: Insufficient documentation

## 2024-03-02 DIAGNOSIS — Z01818 Encounter for other preprocedural examination: Secondary | ICD-10-CM

## 2024-03-02 LAB — POCT PREGNANCY, URINE: Preg Test, Ur: NEGATIVE

## 2024-03-02 SURGERY — REPAIR, HERNIA, UMBILICAL, ROBOT-ASSISTED
Anesthesia: General

## 2024-03-02 MED ORDER — HYDROMORPHONE HCL 1 MG/ML IJ SOLN
INTRAMUSCULAR | Status: DC | PRN
  Administered 2024-03-02: .5 mg via INTRAVENOUS
  Administered 2024-03-02: 1 mg via INTRAVENOUS
  Administered 2024-03-02: .5 mg via INTRAVENOUS
  Administered 2024-03-02 (×2): 1 mg via INTRAVENOUS

## 2024-03-02 MED ORDER — GABAPENTIN 300 MG PO CAPS
300.0000 mg | ORAL_CAPSULE | ORAL | Status: AC
  Administered 2024-03-02: 300 mg via ORAL
  Filled 2024-03-02: qty 1

## 2024-03-02 MED ORDER — BUPIVACAINE-EPINEPHRINE (PF) 0.25% -1:200000 IJ SOLN
INTRAMUSCULAR | Status: AC
  Filled 2024-03-02: qty 30

## 2024-03-02 MED ORDER — FENTANYL CITRATE PF 50 MCG/ML IJ SOSY: 25.0000 ug | PREFILLED_SYRINGE | INTRAMUSCULAR | Status: DC | PRN

## 2024-03-02 MED ORDER — CEFAZOLIN SODIUM-DEXTROSE 2-4 GM/100ML-% IV SOLN
2.0000 g | INTRAVENOUS | Status: AC
  Administered 2024-03-02: 2 g via INTRAVENOUS
  Filled 2024-03-02: qty 100

## 2024-03-02 MED ORDER — PROPOFOL 10 MG/ML IV BOLUS
INTRAVENOUS | Status: AC
  Filled 2024-03-02: qty 20

## 2024-03-02 MED ORDER — LIDOCAINE HCL (PF) 2 % IJ SOLN
INTRAMUSCULAR | Status: DC | PRN
  Administered 2024-03-02: 100 mg via INTRADERMAL

## 2024-03-02 MED ORDER — FENTANYL CITRATE (PF) 100 MCG/2ML IJ SOLN
INTRAMUSCULAR | Status: DC | PRN
Start: 2024-03-02 — End: 2024-03-02
  Administered 2024-03-02: 100 ug via INTRAVENOUS
  Administered 2024-03-02: 50 ug via INTRAVENOUS
  Administered 2024-03-02: 100 ug via INTRAVENOUS
  Administered 2024-03-02: 50 ug via INTRAVENOUS

## 2024-03-02 MED ORDER — MIDAZOLAM HCL 2 MG/2ML IJ SOLN
INTRAMUSCULAR | Status: AC
  Filled 2024-03-02: qty 2

## 2024-03-02 MED ORDER — CHLORHEXIDINE GLUCONATE CLOTH 2 % EX PADS: 6.0000 | MEDICATED_PAD | Freq: Once | CUTANEOUS | Status: DC

## 2024-03-02 MED ORDER — OXYCODONE HCL 5 MG PO TABS
5.0000 mg | ORAL_TABLET | ORAL | Status: DC | PRN
  Administered 2024-03-02 – 2024-03-03 (×4): 10 mg via ORAL
  Filled 2024-03-02 (×4): qty 2

## 2024-03-02 MED ORDER — ROCURONIUM BROMIDE 10 MG/ML (PF) SYRINGE
PREFILLED_SYRINGE | INTRAVENOUS | Status: AC
  Filled 2024-03-02: qty 10

## 2024-03-02 MED ORDER — OXYCODONE HCL 5 MG PO TABS: 5.0000 mg | ORAL_TABLET | Freq: Once | ORAL | Status: DC | PRN

## 2024-03-02 MED ORDER — HYDROMORPHONE HCL 2 MG/ML IJ SOLN
INTRAMUSCULAR | Status: AC
  Filled 2024-03-02: qty 1

## 2024-03-02 MED ORDER — DEXAMETHASONE SODIUM PHOSPHATE 10 MG/ML IJ SOLN
INTRAMUSCULAR | Status: DC | PRN
  Administered 2024-03-02: 10 mg via INTRAVENOUS

## 2024-03-02 MED ORDER — METHOCARBAMOL 1000 MG/10ML IJ SOLN: 500.0000 mg | Freq: Three times a day (TID) | INTRAMUSCULAR | Status: DC | PRN

## 2024-03-02 MED ORDER — ROCURONIUM BROMIDE 10 MG/ML (PF) SYRINGE
PREFILLED_SYRINGE | INTRAVENOUS | Status: DC | PRN
  Administered 2024-03-02: 70 mg via INTRAVENOUS
  Administered 2024-03-02 (×4): 10 mg via INTRAVENOUS

## 2024-03-02 MED ORDER — ACETAMINOPHEN 500 MG PO TABS
1000.0000 mg | ORAL_TABLET | Freq: Four times a day (QID) | ORAL | Status: DC
  Administered 2024-03-02 – 2024-03-03 (×4): 1000 mg via ORAL
  Filled 2024-03-02 (×4): qty 2

## 2024-03-02 MED ORDER — FENTANYL CITRATE (PF) 100 MCG/2ML IJ SOLN
INTRAMUSCULAR | Status: AC
  Filled 2024-03-02: qty 2

## 2024-03-02 MED ORDER — LACTATED RINGERS IV SOLN: INTRAVENOUS | Status: DC

## 2024-03-02 MED ORDER — OXYCODONE HCL 5 MG/5ML PO SOLN: 5.0000 mg | Freq: Once | ORAL | Status: DC | PRN

## 2024-03-02 MED ORDER — BUPIVACAINE-EPINEPHRINE 0.25% -1:200000 IJ SOLN
INTRAMUSCULAR | Status: DC | PRN
Start: 2024-03-02 — End: 2024-03-02
  Administered 2024-03-02: 30 mL

## 2024-03-02 MED ORDER — ENOXAPARIN SODIUM 40 MG/0.4ML IJ SOSY
40.0000 mg | PREFILLED_SYRINGE | INTRAMUSCULAR | Status: DC
  Administered 2024-03-03: 40 mg via SUBCUTANEOUS
  Filled 2024-03-02: qty 0.4

## 2024-03-02 MED ORDER — DEXAMETHASONE SODIUM PHOSPHATE 10 MG/ML IJ SOLN
INTRAMUSCULAR | Status: AC
  Filled 2024-03-02: qty 1

## 2024-03-02 MED ORDER — CELECOXIB 200 MG PO CAPS
200.0000 mg | ORAL_CAPSULE | ORAL | Status: AC
  Administered 2024-03-02: 200 mg via ORAL
  Filled 2024-03-02: qty 1

## 2024-03-02 MED ORDER — ONDANSETRON HCL 4 MG/2ML IJ SOLN
INTRAMUSCULAR | Status: AC
  Filled 2024-03-02: qty 2

## 2024-03-02 MED ORDER — BUPIVACAINE LIPOSOME 1.3 % IJ SUSP
INTRAMUSCULAR | Status: DC | PRN
  Administered 2024-03-02 (×2): 10 mL

## 2024-03-02 MED ORDER — ORAL CARE MOUTH RINSE: 15.0000 mL | Freq: Once | OROMUCOSAL | Status: AC

## 2024-03-02 MED ORDER — PROPOFOL 10 MG/ML IV BOLUS
INTRAVENOUS | Status: DC | PRN
Start: 2024-03-02 — End: 2024-03-02
  Administered 2024-03-02: 200 mg via INTRAVENOUS
  Administered 2024-03-02: 20 mg via INTRAVENOUS

## 2024-03-02 MED ORDER — HYDROMORPHONE HCL 1 MG/ML IJ SOLN
1.0000 mg | INTRAMUSCULAR | Status: DC | PRN
  Administered 2024-03-02: 1 mg via INTRAVENOUS
  Filled 2024-03-02: qty 1

## 2024-03-02 MED ORDER — ACETAMINOPHEN 500 MG PO TABS
1000.0000 mg | ORAL_TABLET | ORAL | Status: AC
  Administered 2024-03-02: 1000 mg via ORAL
  Filled 2024-03-02: qty 2

## 2024-03-02 MED ORDER — ONDANSETRON 4 MG PO TBDP: 4.0000 mg | ORAL_TABLET | Freq: Four times a day (QID) | ORAL | Status: DC | PRN

## 2024-03-02 MED ORDER — BUPIVACAINE-EPINEPHRINE (PF) 0.25% -1:200000 IJ SOLN
INTRAMUSCULAR | Status: DC | PRN
  Administered 2024-03-02 (×2): 20 mL

## 2024-03-02 MED ORDER — LIDOCAINE HCL (PF) 2 % IJ SOLN
INTRAMUSCULAR | Status: AC
  Filled 2024-03-02: qty 5

## 2024-03-02 MED ORDER — ONDANSETRON HCL 4 MG/2ML IJ SOLN: 4.0000 mg | Freq: Four times a day (QID) | INTRAMUSCULAR | Status: DC | PRN

## 2024-03-02 MED ORDER — SUGAMMADEX SODIUM 200 MG/2ML IV SOLN
INTRAVENOUS | Status: DC | PRN
Start: 2024-03-02 — End: 2024-03-02
  Administered 2024-03-02: 200 mg via INTRAVENOUS

## 2024-03-02 MED ORDER — METHOCARBAMOL 500 MG PO TABS
500.0000 mg | ORAL_TABLET | Freq: Three times a day (TID) | ORAL | Status: DC | PRN
  Administered 2024-03-02 – 2024-03-03 (×2): 500 mg via ORAL
  Filled 2024-03-02 (×2): qty 1

## 2024-03-02 MED ORDER — BUPIVACAINE LIPOSOME 1.3 % IJ SUSP
INTRAMUSCULAR | Status: AC
  Filled 2024-03-02: qty 20

## 2024-03-02 MED ORDER — DROPERIDOL 2.5 MG/ML IJ SOLN: 0.6250 mg | Freq: Once | INTRAMUSCULAR | Status: DC | PRN

## 2024-03-02 MED ORDER — MIDAZOLAM HCL 5 MG/5ML IJ SOLN
INTRAMUSCULAR | Status: DC | PRN
  Administered 2024-03-02: 2 mg via INTRAVENOUS

## 2024-03-02 MED ORDER — ONDANSETRON HCL 4 MG/2ML IJ SOLN
INTRAMUSCULAR | Status: DC | PRN
  Administered 2024-03-02: 4 mg via INTRAVENOUS

## 2024-03-02 MED ORDER — CHLORHEXIDINE GLUCONATE 0.12 % MT SOLN
15.0000 mL | Freq: Once | OROMUCOSAL | Status: AC
  Administered 2024-03-02: 15 mL via OROMUCOSAL

## 2024-03-02 SURGICAL SUPPLY — 48 items
APPLICATOR COTTON TIP 6 STRL (MISCELLANEOUS) IMPLANT
APPLICATOR COTTON TIP 6IN STRL (MISCELLANEOUS) IMPLANT
BAG COUNTER SPONGE SURGICOUNT (BAG) IMPLANT
BLADE SURG SZ11 CARB STEEL (BLADE) ×1 IMPLANT
CHLORAPREP W/TINT 26 (MISCELLANEOUS) ×1 IMPLANT
COVER SURGICAL LIGHT HANDLE (MISCELLANEOUS) ×1 IMPLANT
COVER TIP SHEARS 8 DVNC (MISCELLANEOUS) ×1 IMPLANT
DEFOGGER SCOPE WARM SEASHARP (MISCELLANEOUS) IMPLANT
DERMABOND ADVANCED .7 DNX12 (GAUZE/BANDAGES/DRESSINGS) ×1 IMPLANT
DRAPE ARM DVNC X/XI (DISPOSABLE) ×4 IMPLANT
DRAPE COLUMN DVNC XI (DISPOSABLE) ×1 IMPLANT
DRIVER NDL MEGA SUTCUT DVNCXI (INSTRUMENTS) ×1 IMPLANT
DRIVER NDLE MEGA SUTCUT DVNCXI (INSTRUMENTS) ×1 IMPLANT
ELECT PENCIL ROCKER SW 15FT (MISCELLANEOUS) ×1 IMPLANT
ELECT REM PT RETURN 15FT ADLT (MISCELLANEOUS) ×1 IMPLANT
GAUZE 4X4 16PLY ~~LOC~~+RFID DBL (SPONGE) ×1 IMPLANT
GLOVE BIO SURGEON STRL SZ7.5 (GLOVE) ×2 IMPLANT
GOWN STRL REUS W/ TWL XL LVL3 (GOWN DISPOSABLE) ×2 IMPLANT
IRRIGATION SUCT STRKRFLW 2 WTP (MISCELLANEOUS) IMPLANT
KIT BASIN OR (CUSTOM PROCEDURE TRAY) ×1 IMPLANT
KIT TURNOVER KIT A (KITS) IMPLANT
LHOOK LAP DISP 36CM (ELECTROSURGICAL) IMPLANT
MARKER SKIN DUAL TIP RULER LAB (MISCELLANEOUS) ×1 IMPLANT
MESH SOFT 12X12IN BARD (Mesh General) IMPLANT
NDL HYPO 22X1.5 SAFETY MO (MISCELLANEOUS) ×1 IMPLANT
NDL INSUFFLATION 14GA 120MM (NEEDLE) ×1 IMPLANT
NEEDLE HYPO 22X1.5 SAFETY MO (MISCELLANEOUS) ×1 IMPLANT
NEEDLE INSUFFLATION 14GA 120MM (NEEDLE) ×1 IMPLANT
OBTURATOR OPTICALSTD 8 DVNC (TROCAR) ×1 IMPLANT
PACK CARDIOVASCULAR III (CUSTOM PROCEDURE TRAY) ×1 IMPLANT
SCISSORS MNPLR CVD DVNC XI (INSTRUMENTS) ×1 IMPLANT
SEAL UNIV 5-12 XI (MISCELLANEOUS) ×4 IMPLANT
SOLUTION ANTFG W/FOAM PAD STRL (MISCELLANEOUS) ×1 IMPLANT
SOLUTION ELECTROSURG ANTI STCK (MISCELLANEOUS) ×1 IMPLANT
SPIKE FLUID TRANSFER (MISCELLANEOUS) ×1 IMPLANT
SUT MNCRL AB 4-0 PS2 18 (SUTURE) ×1 IMPLANT
SUT STRATA PDS 2-0 23 CT-1 (SUTURE) IMPLANT
SUT STRATAFIX PDS+0 CT1 9 (SUTURE) IMPLANT
SUT STRATAFIX SPIRAL PDS3-0 (SUTURE) IMPLANT
SUT VIC AB 2-0 SH 27X BRD (SUTURE) IMPLANT
SUTURE V-LC BRB 180 2/0GR6GS22 (SUTURE) IMPLANT
SYR 10ML LL (SYRINGE) ×1 IMPLANT
SYR 20ML LL LF (SYRINGE) ×1 IMPLANT
TOWEL GREEN STERILE FF (TOWEL DISPOSABLE) ×1 IMPLANT
TOWEL OR 17X26 10 PK STRL BLUE (TOWEL DISPOSABLE) ×1 IMPLANT
TROCAR Z-THREAD FIOS 5X100MM (TROCAR) IMPLANT
TROCAR Z-THREAD OPTICAL 5X100M (TROCAR) IMPLANT
TUBING INSUFFLATION 10FT LAP (TUBING) ×1 IMPLANT

## 2024-03-02 NOTE — Anesthesia Procedure Notes (Signed)
 Procedure Name: Intubation Date/Time: 03/02/2024 8:22 AM  Performed by: Micky Albee, CRNAPre-anesthesia Checklist: Patient identified, Emergency Drugs available, Suction available, Patient being monitored and Timeout performed Patient Re-evaluated:Patient Re-evaluated prior to induction Oxygen Delivery Method: Circle system utilized Preoxygenation: Pre-oxygenation with 100% oxygen Induction Type: IV induction Ventilation: Mask ventilation without difficulty Laryngoscope Size: Mac and 4 Grade View: Grade I Tube type: Oral Tube size: 7.0 mm Number of attempts: 1 Airway Equipment and Method: Stylet Placement Confirmation: ETT inserted through vocal cords under direct vision and breath sounds checked- equal and bilateral Secured at: 22 cm Tube secured with: Tape Dental Injury: Teeth and Oropharynx as per pre-operative assessment

## 2024-03-02 NOTE — Anesthesia Procedure Notes (Signed)
 Anesthesia Regional Block: TAP block   Pre-Anesthetic Checklist: , timeout performed,  Correct Patient, Correct Site, Correct Laterality,  Correct Procedure, Correct Position, site marked,  Risks and benefits discussed,  Surgical consent,  Pre-op evaluation,  At surgeon's request and post-op pain management  Laterality: Right and Left  Prep: chloraprep       Needles:  Injection technique: Single-shot  Needle Type: Echogenic Needle     Needle Length: 9cm  Needle Gauge: 21     Additional Needles:   Procedures:,,,, ultrasound used (permanent image in chart),,    Narrative:  Start time: 03/02/2024 8:22 AM End time: 03/02/2024 8:30 AM Injection made incrementally with aspirations every 5 mL.  Performed by: Personally  Anesthesiologist: Peggy Bowens, MD

## 2024-03-02 NOTE — Transfer of Care (Signed)
 Immediate Anesthesia Transfer of Care Note  Patient: Amanda Jarvis  Procedure(s) Performed: REPAIR, HERNIA, UMBILICAL, ROBOT-ASSISTED  Patient Location: PACU  Anesthesia Type:General  Level of Consciousness: sedated  Airway & Oxygen Therapy: Patient Spontanous Breathing and Patient connected to face mask oxygen  Post-op Assessment: Report given to RN and Post -op Vital signs reviewed and stable  Post vital signs: Reviewed and stable  Last Vitals:  Vitals Value Taken Time  BP 129/91 03/02/24 1213  Temp    Pulse 110 03/02/24 1215  Resp 11 03/02/24 1215  SpO2 99 % 03/02/24 1215  Vitals shown include unfiled device data.  Last Pain:  Vitals:   03/02/24 0642  TempSrc:   PainSc: 0-No pain         Complications: No notable events documented.

## 2024-03-02 NOTE — Anesthesia Preprocedure Evaluation (Signed)
 Anesthesia Evaluation  Patient identified by MRN, date of birth, ID band Patient awake    Reviewed: Allergy & Precautions, NPO status , Patient's Chart, lab work & pertinent test results  Airway Mallampati: II  TM Distance: >3 FB Neck ROM: Full    Dental  (+) Dental Advisory Given   Pulmonary former smoker   breath sounds clear to auscultation       Cardiovascular negative cardio ROS  Rhythm:Regular Rate:Normal     Neuro/Psych negative neurological ROS     GI/Hepatic negative GI ROS, Neg liver ROS,,,  Endo/Other  negative endocrine ROS    Renal/GU negative Renal ROS     Musculoskeletal   Abdominal   Peds  Hematology  (+) Blood dyscrasia, Sickle cell trait and anemia   Anesthesia Other Findings   Reproductive/Obstetrics                             Anesthesia Physical Anesthesia Plan  ASA: 2  Anesthesia Plan: General   Post-op Pain Management: Tylenol  PO (pre-op)*, Celebrex  PO (pre-op)*, Gabapentin PO (pre-op)* and Regional block*   Induction:   PONV Risk Score and Plan: 3 and Dexamethasone , Ondansetron , Midazolam  and Treatment may vary due to age or medical condition  Airway Management Planned: Oral ETT  Additional Equipment: None  Intra-op Plan:   Post-operative Plan: Extubation in OR  Informed Consent: I have reviewed the patients History and Physical, chart, labs and discussed the procedure including the risks, benefits and alternatives for the proposed anesthesia with the patient or authorized representative who has indicated his/her understanding and acceptance.     Dental advisory given  Plan Discussed with: CRNA  Anesthesia Plan Comments:        Anesthesia Quick Evaluation

## 2024-03-02 NOTE — Plan of Care (Signed)
   Problem: Education: Goal: Knowledge of General Education information will improve Description: Including pain rating scale, medication(s)/side effects and non-pharmacologic comfort measures Outcome: Progressing   Problem: Nutrition: Goal: Adequate nutrition will be maintained Outcome: Progressing

## 2024-03-02 NOTE — Op Note (Signed)
 Amanda Jarvis ([id number])  Operative Report  Date 03/02/2024  PREOPERATIVE DIAGNOSIS:  Incisional Ventral hernia (8cm x 4cm) Rectus Diastasis (22cm x 4cm)  POSTOPERATIVE DIAGNOSIS: Same  SURGEON: Armond Bertin, MD  ANESTHESIA: General   PROCEDURE:   1. Diagnostic laparoscopy. 2. Robotic Muscular release of the left rectus muscle to facilitate retromuscular hernia repair. 3. Robotic Muscular release of the right rectus muscle to facilitate retromuscular hernia repair. 4. Robotic Rives-Stoppa hernia repair with mesh 28 x 22 cm Bard Soft mesh. 5. Repair of rectus diastasis (22cm x 4cm)  INTRAOPERATIVE FINDINGS: Incisional hernia with a fascial defect of 8 cm (craniocaudal) x 4 cm (transverse); total surface area for adjacent tissue transfer: 352 square centimeters. Diastasis measuring 22cm x 4cm  ESTIMATED BLOOD LOSS:  HERNIA CHARACTERISTICS: Location: incisional Approach: Robotic Initial/Recurrent: Initial  Reducible/Incarcerated: incarcerated Mesh Implantation: Yes Hernia Size: 8cm x 4cm Total Surface Area for Adjacent Tissue Transfer: 352 square centimeters  IMPLANTS: Implant Name Type Inv. Item Serial No. Manufacturer Lot No. LRB No. Used Action  MESH SOFT 12X12IN BARD - [id number] Mesh General MESH SOFT 12X12IN BARD  [employer] BARD ACCESS [id number] N/A 1 Implanted    COMPLICATIONS: None  SPECIMENS: None  OPERATIVE INDICATIONS: Pt is a 38 y.o. female who had previously undergone a laparoscopic tubal ligation. She had a known umbilical hernia but then developed an incisional hernia from her previous surgery. I offered her surgical repair. On exam, she also had a large rectus diastasis and reported symptoms related to this.  The risks, benefits and alternatives of the procedure were explained to the patient and the patient agreed to proceed and signed informed consent in front of a witness.   DESCRIPTION OF PROCEDURE: After preoperative identifying  the patient in holding, the patient was brought to the operating room and placed supine on the operating room table. SCDs were placed as indicated. After induction of general anesthesia,  appropriate perioperative antibiotics were administered.  The operative site was prepped and draped in the typical sterile fashion.  A JACHO approved time out, where the name of the patient, the operation, and intended site was confirmed.  The procedure was begun.    The patient was appropriately marked to identify the linea alba as well as the linea semilunaris and, accessing the rectus sheath in the subcostal position, an optical trocar was placed in the left retromuscular space.  Under direct vision with a 5 mm camera, the left retromuscular space was dissected, freeing the rectus muscle from the posterior sheath.  This space was insufflated without incident. Examination revealed no evidence of bleeding and we were able to stay in the retromuscular position.  I noted that there was insufflation of the intra-abdominal space and was concerned that I had entered the abdomen. I therefore placed a RUQ 5mm port via optiview technique. I introduced a laparoscope. There was no evidence of injury from entry. I could appreciate 2 defects along the midline, both containing omentum.  There was a small defect within the posterior sheath allowing air into the abdomen. There was no evidence of damage to structures in this area. I left the RUQ port open to allow for decompression of the abdomen. Three additional ports were placed, one in the left upper quadrant and one in the left flank, and one in the left lower quadrant, all in the retromuscular space.  We turned our attention to creation of the myofascial releases of the abdominal wall to facilitate primary fascial closure via a Robotic  Rives-Stoppa approach.  We performed a left-sided myofascial flap via a posterior release, by mobilizing the retrorectus space. The left retromuscular  space was released from the posterior sheath to the edge of the linea semilunaris to provide adequate medialization for repair of the ventral hernia.  The belly of the rectus muscle was identified and bluntly separated from the posterior rectus sheath out toward the linea semilunaris. This space was mobilized superiorly above the costal margin. Inferiorly, the retrorectus space was mobilized toward the pubis  taking care to preserve the inferior epigastric vessels.  We confirmed hemostasis.  We found we had about a 8 cm release on the left side, facilitating bringing the linea alba close to the midline without tension. The right-side would not medialize enough toward the midline. We again confirmed hemostasis in the posterior rectus sheath.     To facilitate tension-free primary fascial closure, we then decided to mobilize a Right-sided myofascial flap via a posterior release.  Then, attention was then turned to the linea alba and dissection was taken across the linea alba and into the right retromuscular space which was subsequently mobilized and released.  The belly of the rectus muscle was identified and bluntly separated from the posterior rectus sheath out to the linea semilunaris. The space was mobilized superiorly above the costal margin.   The epigastric vessels were preserved. Inferiorly, the retrorectus space was mobilized toward the pubis and out to the linea semilunaris, taking care to preserve the inferior epigastric vessels.   We confirmed hemostasis.   This provided adequate medialization for repair of the ventral hernia.  We had about a 8 cm release also on this side, which would allow us  to bring the linea alba to midline without tension.     At this point, both sides would well medialize for tension-free primary fascial closure.  We confirmed hemostasis in these planes. We confirmed all sponge, needle, and instrument counts were reported correct. The resulting posterior sheath defect, after  bilateral posterior rectus myofascial releases, measured 4cm x 4cm.  Having reduced the hernia in its entirety during our dissection and mobilization, the dissection revealed evidence of 8 cm x 4 cm herniation at the midline that was actually 2 separate defects.  The reduced contents were confirmed to be hemostatic and without damage.    Once our dissection was adequate, attention was turned to the anterior sheath and the anterior sheath was reapproximated with a #1 absorbable Stratafix Symmetric suture with relative ease.  Given the presence of the diastasis and the fact that she had reported symptoms, the decision was made to fix this as well. The total area of diastasis repair was 22cm x 4cm. Likewise, the posterior sheath defect was reapproximated with a 2-0 Stratafix Spiral suture to repair any component of peritoneal defect and facilitate placement of our retromuscular mesh.  Once we were satisfied with our dissection, a 22cm x 28cm piece of Bard Soft a introduced into the retromuscular space and placed with adequate coverage of the defect.  There was good attachment of the mesh and we were pleased with repair.  At this junction, the ports were all removed under direct visualization.  The laparoscopic wounds were closed with 0000 Monocryl sutures.  After confirming twice that the sponge, needle and instrument counts were correct, the procedure was terminated.  The patient was extubated and transferred to the recovery room in stable condition.  DISPOSITION: Stable to PACU.   Electronically Signed By: Cannon Champion 03/02/2024 12:04 PM

## 2024-03-02 NOTE — H&P (Signed)
 Amanda Jarvis [birth year]  [id number].    HPI:  38 year old female who 2 ventral hernias and rectus diastasis who presents for elective repair. She reports that she is in her usual state of health and denies any recent changes in medications.   ROS: Review of Systems  Constitutional: Negative.   HENT: Negative.    Eyes: Negative.   Respiratory: Negative.    Cardiovascular: Negative.   Gastrointestinal: Negative.   Genitourinary: Negative.   Musculoskeletal: Negative.   Skin: Negative.   Neurological: Negative.   Endo/Heme/Allergies: Negative.   Psychiatric/Behavioral: Negative.      Family History  Problem Relation Age of Onset   Hypertension Mother    Hyperlipidemia Maternal Grandmother    Stroke Maternal Grandmother    Stroke Paternal Grandfather    Heart disease Paternal Grandfather     Past Medical History:  Diagnosis Date   Anemia    with 1st pregnancy   Blood dyscrasia    Sickle cell trait   Complication of anesthesia    scared of surgery- wasn't fully sedated for her BTL surgery and she felt the incision   Elevated LFTs    Fatty liver    Medical history non-contributory    Sickle cell trait (HCC)    Substance abuse (HCC)    remote hx of ETOH, cocaine, THC    Past Surgical History:  Procedure Laterality Date   NO PAST SURGERIES     TUBAL LIGATION Bilateral 07/24/2013   Procedure: POST PARTUM TUBAL LIGATION;  Surgeon: Denette Finner, MD;  Location: [facility];  Service: Gynecology;  Laterality: Bilateral;    Social History:  reports that she quit smoking about 2 years ago. Her smoking use included cigarettes. She has never used smokeless tobacco. She reports current alcohol use of about 4.0 standard drinks of alcohol per week. She reports that she does not currently use drugs.  Allergies: No Known Allergies  Medications Prior to Admission  Medication Sig Dispense Refill   ibuprofen  (ADVIL ) 200 MG tablet Take 400 mg by mouth every 6 (six)  hours as needed (For sinus headache).     tetrahydrozoline 0.05 % ophthalmic solution Place 1 drop into both eyes daily as needed (red eyes). Visine      Physical Exam: Blood pressure 120/74, pulse 71, temperature 99.1 F (37.3 C), temperature source Oral, resp. rate 16, height 5' 5.5" (1.664 m), weight 76.2 kg, last menstrual period 02/12/2024, SpO2 96%. Gen: female, NAD Abd: soft, non-distended, reducible hernia   Results for orders placed or performed during the hospital encounter of 03/02/24 (from the past 48 hours)  Pregnancy, urine POC     Status: None   Collection Time: 03/02/24  6:21 AM  Result Value Ref Range   Preg Test, Ur NEGATIVE NEGATIVE    Comment:        THE SENSITIVITY OF THIS METHODOLOGY IS >24 mIU/mL    No results found.  Assessment/Plan 38 y/o F w/ 2 ventral hernias as well as rectus diastasis who presents for elective repair.  - Will proceed to the OR. We discussed the alternatives and potential risks of surgery, including but not limited to: bleeding, infection, damage to bowel or surrounding structures, mesh complications, chronic pain, recurrent hernia, need to convert to an open operation, and need for additional procedures. All questions were addressed and consent was obtained.    Trula Gable [facility] 03/02/2024, 7:57 AM Please see Amion for pager number during day  hours 7:00am-4:30pm or 7:00am -11:30am on weekends

## 2024-03-03 DIAGNOSIS — K439 Ventral hernia without obstruction or gangrene: Secondary | ICD-10-CM | POA: Diagnosis not present

## 2024-03-03 LAB — CBC
HCT: 36.7 % (ref 36.0–46.0)
Hemoglobin: 12.6 g/dL (ref 12.0–15.0)
MCH: 31.7 pg (ref 26.0–34.0)
MCHC: 34.3 g/dL (ref 30.0–36.0)
MCV: 92.4 fL (ref 80.0–100.0)
Platelets: 213 10*3/uL (ref 150–400)
RBC: 3.97 MIL/uL (ref 3.87–5.11)
RDW: 12.6 % (ref 11.5–15.5)
WBC: 9.3 10*3/uL (ref 4.0–10.5)
nRBC: 0 % (ref 0.0–0.2)

## 2024-03-03 MED ORDER — ACETAMINOPHEN 325 MG PO TABS: 650.0000 mg | ORAL_TABLET | Freq: Four times a day (QID) | ORAL | 0 refills | Status: AC

## 2024-03-03 MED ORDER — IBUPROFEN 200 MG PO TABS: 600.0000 mg | ORAL_TABLET | Freq: Four times a day (QID) | ORAL | 0 refills | Status: AC

## 2024-03-03 MED ORDER — OXYCODONE HCL 5 MG PO TABS: 5.0000 mg | ORAL_TABLET | Freq: Three times a day (TID) | ORAL | 0 refills | Status: AC | PRN

## 2024-03-03 NOTE — Discharge Instructions (Signed)
 Home Care After Hernia Repair  Activity  Limit activity for the first 24 hours, then you may return to normal daily activities. Returning to normal daily activities as soon as you can following surgery will enhance recovery time.  No heavy lifting pushing or pulling, anything heavier than 10 pounds (gallon of milk weighs approx. 8.8 pounds) for 4-6 weeks from surgery date.  Do not mow the lawn, use a vacuum cleaner, or do any other strenuous activities without first consulting your surgical team.  Climb stairs slowly and watch your step.  Walk as often as you feel able to increase strength and endurance.  No driving or operating heavy machinery within 24 hours of taking narcotic pain medication.  Diet  Drink plenty of fluids and eat a light meal on the night of surgery. Some patients may find their appetite is poor for a week or two after surgery. This is a normal result of the stress of surgery-your appetite will return in time.   There are no specific diet restrictions after surgery.  Dressings and Wound Care  Dermabond/Durabond (skin glue): This will usually remain in place for 10-14 days, then naturally fall off your skin. You may take a shower 24 hrs after  surgery, carefully wash, not scrub the incision site with a mild non-scented soap. Pat dry with a soft towel.  Do not pick or peel skin glue off.  You can shower and let the water fall on the dressings above. Do not soak or submerge your incision(s) in a bath tub, hot tub, or swimming pool, until your doctor says it is ok to do so or the incision(s) have completely healed, usually about 2-4 weeks.  Do not use creams, powder, salves or balms on your incision(s).  What to Expect After Surgery   Moderate discomfort controlled with medications  Minimal drainage from incision  You may feel pain in one or both shoulders. This pain comes from the gas still left in your belly after the surgery, if you had laparoscopic surgery (several small  incisions). The pain should ease over several days to a week. Ambulation will help with this pain.   Belly swelling  Feeling fatigue and weak  Constipation after surgery is common. Drink plenty fluids and eat a high fiber diet.  Swelling - In some patients might feel that their hernia has returned after surgery-DO NOT Worry this is normal. Swelling may be due to the development of a seroma. Seroma is fluid that has built up where the hernia was repaired this is a normal result after surgery and it will slowly reabsorb back into your body over the next several weeks.   Pain Control: Prescribed Non-Narcotic Pain Medication  You will be given three prescriptions.  Two of them will be for prescription strength ibuprofen (i.e. Advil) and prescription strength acetaminophen (i.e. Tylenol).  The vast majority of patients will just need these two medications.  One prescription will be for a 'rescue' prescription of an oral narcotic (oxycodone).  You may fill this if needed.  You will alternate taking the ibuprofen (600mg ) every 6 hours and also the acetaminophen (650mg ) every 6 hours so that you are taking one of those medications every 3 hours.  For example: o 0800 - take ibuprofen 600mg  o 1100 - take acetaminophen 650mg  o 1400 - take ibuprofen 600mg  o 1700 - take acetaminophen 650mg  o Etc.  Continue taking this alternating pattern of ibuprofen and acetaminophen for 3 days  If you cannot take one  or the other of these medications, just take the one you can every 6 hours.  If you are comfortable at night, you don't have to wake up and take a medication.  If you are still uncomfortable after taking either ibuprofen or acetaminophen, try gentle stretching exercise and ice packs (a bag of frozen vegetables works great).  If you are still uncomfortable, you may fill the narcotic prescription of Oxycodone and take as directed.  Once you have completed these prescriptions, your pain level should be low enough  to stop taking medications altogether or just use an over the counter medication (ibuprofen or acetaminophen) as needed.   Pain Control: Over the Counter Medications to take as needed:  Colace/Docusate: May be prescribed by your surgeon to prevent constipation caused by the combination of narcotics, effects of anesthesia, and decreased ambulation.  Hold for loose stools or diarrhea. Take 100 mg 1-2 times a day starting tonight.   Fiber: High fiber foods, extra liquids (water 9-13 cups/day) can also assist with constipation. Examples of high fiber foods are fruit, bran. Prune juice and water are also good liquids to drink.  Milk of Magnesia/Miralax:  If constipated despite take the over the counter stool softeners, you may take Milk of Magnesia or Miralax as directed on bottle to assist with constipation.     Pepcid/Famotidine: May be prescribed while taking naproxen (Aleve) or other NSAIDs such as ibuprofen (Motrin/Advil) to prevent stomach upset or Acid-reflux symptoms. Take 1 tablet 1-2 times a day.   **Constipation: The first bowel movement may occur anywhere between 1-5 days after surgery.  As long as you are not nauseated or not having significant abdominal pain this variation is acceptable.  Narcotic pain medications can cause constipation increasing discomfort; early discontinuation will assist with bowel management.If constipated despite taking stool softeners, you may take Milk of Magnesia or Miralax as directed on the bottle.     **Home medications: You may restart your home medications as directed by your respective Primary Care Physician or Surgeon.  When to notify your Doctor or Healthcare Team:   Sign of Wound Infection   Fever over 100 degrees.  Wound becomes extremely swollen, shows red streaks, warm to the touch, and/or drainage from the incision site or foul-smelling drainage.  Wound edges separate or opens up  Bleeding or bruising   If you have bleeding, apply pressure to the  site and hold the pressure firmly for 5 minutes. If the bleeding continues, apply pressure again and call 911. If the bleeding stopped, call your doctor to report it.   Call your doctor or nurse if you have increased bleeding from your site and increased bruising or a lump forms or gets larger under your skin at the site.  Unrelieved Pain    Call your doctor or nurse if your pain gets worse or is not eased 1 hour after taking your pain medicine, or if it is severe and uncontrolled.  Nausea and Vomiting   Call your doctor or nurse if you have nausea and vomiting that continues more than 24 hours, will not let you keep medicine down and will not let you keep fluids down  Fever, Flu-like symptoms   Fever over 100 degrees and/or chills  Gastrointestinal Bleeding Symptoms    Black tarry bowel movements.  This can be normal after surgery on the stomach, but should resolve in a day or two.    Call 911 if you suddenly have signs of blood loss such as:  Vomiting blood  Fast heart rate  Feeling faint, sweaty, or blacking out  Passing bright red blood from your rectum  Blood Clot Symptoms   Tender, swollen or reddened areas in your calf muscle or thighs.  Numbness or tingling in your lower leg or calf, or at the top of your leg or groin  Skin on your leg looks pale or blue or feels cold to touch  Chest pain or have trouble breathing, lightheadedness, fast heart rate  Sudden Onset of Symptoms    Call 911 if you suddenly have:  Leg weakness and spasm  Loss of bladder or bowel function  Seizure  Confusion, severe headache, dizziness or feeling unsteady, problems talking, difficulty swallowing, and/or numbness or muscle weakness as these could be signs of a stroke.   Follow up Appointment Your follow up appointment should be scheduled 2-3 weeks after your surgery date.  If you have not previously scheduled for a follow-up visit you can be scheduled by contacting [phone].

## 2024-03-03 NOTE — Discharge Summary (Signed)
 Physician Discharge Summary  Patient ID: Amanda Jarvis MRN: [id number] DOB/AGE: 04-03-1986 38 y.o.  Admit date: 03/02/2024 Discharge date: 03/03/2024  Admission Diagnoses:  Discharge Diagnoses:  Principal Problem:   Incisional hernia   Discharged Condition: stable  Hospital Course: Donica presented to the hospital on 03/02/2024 for an elective robotic incisional hernia repair. She underwent an uneventful operation and was kept overnight for observation. She discharged to home on POD 1. At the time of discharge she was ambulating at baseline, her pain was well controlled on oral meds, and she was tolerating PO.   Discharge Exam: Blood pressure 118/70, pulse 70, temperature (!) 97.5 F (36.4 C), temperature source Oral, resp. rate 18, height 5' 5.5" (1.664 m), weight 76.2 kg, last menstrual period 02/12/2024, SpO2 97%. Gen: female, NAD Abd: soft, non-distended, port sites clean and dry, appropriately tender, no evidence of hematoma  Disposition: Discharge disposition: 01-Home or Self Care        Allergies as of 03/03/2024   No Known Allergies      Medication List     STOP taking these medications    tetrahydrozoline 0.05 % ophthalmic solution       TAKE these medications    acetaminophen  325 MG tablet Commonly known as: Tylenol  Take 2 tablets (650 mg total) by mouth every 6 (six) hours for 6 days.   ibuprofen  200 MG tablet Commonly known as: Motrin  IB Take 3 tablets (600 mg total) by mouth every 6 (six) hours for 6 days. What changed:  how much to take when to take this reasons to take this   oxyCODONE  5 MG immediate release tablet Commonly known as: Roxicodone  Take 1 tablet (5 mg total) by mouth every 8 (eight) hours as needed.         Signed: Cannon Champion 03/03/2024, 8:52 AM

## 2024-03-03 NOTE — Progress Notes (Signed)
 Discharge instructions given to patient and family questions asked and answered D Rosevelt Constable RN

## 2024-03-03 NOTE — Plan of Care (Signed)
  Problem: Activity: Goal: Risk for activity intolerance will decrease Outcome: Progressing   Problem: Nutrition: Goal: Adequate nutrition will be maintained Outcome: Adequate for Discharge   Problem: Coping: Goal: Level of anxiety will decrease Outcome: Progressing   Problem: Elimination: Goal: Will not experience complications related to urinary retention Outcome: Completed/Met

## 2024-03-03 NOTE — Anesthesia Postprocedure Evaluation (Signed)
 Anesthesia Post Note  Patient: Amanda Jarvis  Procedure(s) Performed: REPAIR, HERNIA, UMBILICAL, ROBOT-ASSISTED     Patient location during evaluation: PACU Anesthesia Type: General Level of consciousness: awake and alert Pain management: pain level controlled Vital Signs Assessment: post-procedure vital signs reviewed and stable Respiratory status: spontaneous breathing, nonlabored ventilation, respiratory function stable and patient connected to nasal cannula oxygen Cardiovascular status: blood pressure returned to baseline and stable Postop Assessment: no apparent nausea or vomiting Anesthetic complications: no   No notable events documented.  Last Vitals:  Vitals:   03/03/24 0136 03/03/24 0515  BP: 108/64 118/70  Pulse: 76 70  Resp: 18 18  Temp: 36.6 C (!) 36.4 C  SpO2: 100% 97%    Last Pain:  Vitals:   03/03/24 1004  TempSrc:   PainSc: 3                  Melvenia Stabs
